# Patient Record
Sex: Female | Born: 1960 | Race: Black or African American | Hispanic: No | Marital: Married | Smoking: Never smoker
Health system: Southern US, Community
[De-identification: ages and names within clinical notes are randomized; demographics above are authoritative.]

## PROBLEM LIST (undated history)

## (undated) DIAGNOSIS — I639 Cerebral infarction, unspecified: Secondary | ICD-10-CM

## (undated) DIAGNOSIS — D649 Anemia, unspecified: Secondary | ICD-10-CM

## (undated) DIAGNOSIS — I10 Essential (primary) hypertension: Secondary | ICD-10-CM

## (undated) HISTORY — PX: COLON SURGERY: SHX602

## (undated) HISTORY — PX: GASTRIC BYPASS: SHX52

## (undated) HISTORY — DX: Anemia, unspecified: D64.9

## (undated) HISTORY — PX: TUBAL LIGATION: SHX77

## (undated) HISTORY — PX: APPENDECTOMY: SHX54

## (undated) HISTORY — DX: Cerebral infarction, unspecified: I63.9

## (undated) HISTORY — PX: ABDOMINAL HYSTERECTOMY: SHX81

## (undated) HISTORY — PX: CHOLECYSTECTOMY: SHX55

## (undated) HISTORY — PX: COSMETIC SURGERY: SHX468

---

## 2006-02-26 ENCOUNTER — Ambulatory Visit (HOSPITAL_COMMUNITY): Admission: RE | Admit: 2006-02-26 | Discharge: 2006-02-26 | Payer: Self-pay | Admitting: Obstetrics & Gynecology

## 2006-03-09 ENCOUNTER — Inpatient Hospital Stay (HOSPITAL_COMMUNITY): Admission: AD | Admit: 2006-03-09 | Discharge: 2006-03-11 | Payer: Self-pay | Admitting: Obstetrics

## 2006-04-02 ENCOUNTER — Ambulatory Visit (HOSPITAL_COMMUNITY): Admission: RE | Admit: 2006-04-02 | Discharge: 2006-04-02 | Payer: Self-pay | Admitting: Gastroenterology

## 2006-04-12 ENCOUNTER — Inpatient Hospital Stay (HOSPITAL_COMMUNITY): Admission: RE | Admit: 2006-04-12 | Discharge: 2006-04-15 | Payer: Self-pay | Admitting: Obstetrics & Gynecology

## 2011-11-02 DIAGNOSIS — E785 Hyperlipidemia, unspecified: Secondary | ICD-10-CM | POA: Insufficient documentation

## 2012-03-14 DIAGNOSIS — Z9884 Bariatric surgery status: Secondary | ICD-10-CM | POA: Insufficient documentation

## 2013-02-20 ENCOUNTER — Emergency Department (HOSPITAL_COMMUNITY)
Admission: EM | Admit: 2013-02-20 | Discharge: 2013-02-20 | Disposition: A | Discharge status: Home / Self Care | Attending: Emergency Medicine | Admitting: Emergency Medicine

## 2013-02-20 ENCOUNTER — Emergency Department (HOSPITAL_COMMUNITY)

## 2013-02-20 DIAGNOSIS — IMO0002 Reserved for concepts with insufficient information to code with codable children: Secondary | ICD-10-CM | POA: Insufficient documentation

## 2013-02-20 DIAGNOSIS — Z79899 Other long term (current) drug therapy: Secondary | ICD-10-CM | POA: Insufficient documentation

## 2013-02-20 DIAGNOSIS — R109 Unspecified abdominal pain: Secondary | ICD-10-CM

## 2013-02-20 DIAGNOSIS — R1084 Generalized abdominal pain: Secondary | ICD-10-CM | POA: Insufficient documentation

## 2013-02-20 DIAGNOSIS — Z792 Long term (current) use of antibiotics: Secondary | ICD-10-CM | POA: Insufficient documentation

## 2013-02-20 LAB — CBC WITH DIFFERENTIAL/PLATELET
Basophils Absolute: 0 10*3/uL (ref 0.0–0.1)
Basophils Relative: 1 % (ref 0–1)
Eosinophils Relative: 1 % (ref 0–5)
Lymphocytes Relative: 18 % (ref 12–46)
MCHC: 32.5 g/dL (ref 30.0–36.0)
MCV: 84.7 fL (ref 78.0–100.0)
Platelets: 264 10*3/uL (ref 150–400)
RDW: 13.4 % (ref 11.5–15.5)
WBC: 6.4 10*3/uL (ref 4.0–10.5)

## 2013-02-20 LAB — COMPREHENSIVE METABOLIC PANEL
ALT: 74 U/L — ABNORMAL HIGH (ref 0–35)
AST: 148 U/L — ABNORMAL HIGH (ref 0–37)
Albumin: 4.2 g/dL (ref 3.5–5.2)
CO2: 28 mEq/L (ref 19–32)
Calcium: 9.9 mg/dL (ref 8.4–10.5)
Creatinine, Ser: 1.02 mg/dL (ref 0.50–1.10)
GFR calc non Af Amer: 63 mL/min — ABNORMAL LOW (ref >90)
Sodium: 140 mEq/L (ref 135–145)
Total Protein: 8.4 g/dL — ABNORMAL HIGH (ref 6.0–8.3)

## 2013-02-20 MED ORDER — ONDANSETRON HCL 4 MG/2ML IJ SOLN
4.0000 mg | Freq: Once | INTRAMUSCULAR | Status: AC
  Administered 2013-02-20: 4 mg via INTRAVENOUS
  Filled 2013-02-20: qty 2

## 2013-02-20 NOTE — ED Notes (Signed)
Bed: WA12 Expected date:  Expected time:  Means of arrival:  Comments: EMS 

## 2013-02-20 NOTE — ED Provider Notes (Signed)
CSN: [id number]     Arrival date & time 02/20/13  1626 History   First MD Initiated Contact with Patient 02/20/13 1639     Chief Complaint  Patient presents with  . Abdominal Pain   (Consider location/radiation/quality/duration/timing/severity/associated sxs/prior Treatment) Patient is a 52 y.o. female presenting with abdominal pain. The history is provided by the patient (pt complains of abd pain).  Abdominal Pain Pain location:  Generalized Pain quality: aching   Pain radiates to:  Does not radiate Pain severity:  Mild Onset quality:  Sudden Timing:  Intermittent Progression:  Partially resolved Associated symptoms: no chest pain, no cough, no diarrhea, no fatigue and no hematuria     No past medical history on file. No past surgical history on file. No family history on file. History  Substance Use Topics  . Smoking status: Not on file  . Smokeless tobacco: Not on file  . Alcohol Use: Not on file   OB History   No data available     Review of Systems  Constitutional: Negative for appetite change and fatigue.  HENT: Negative for congestion, ear discharge and sinus pressure.   Eyes: Negative for discharge.  Respiratory: Negative for cough.   Cardiovascular: Negative for chest pain.  Gastrointestinal: Positive for abdominal pain. Negative for diarrhea.  Genitourinary: Negative for frequency and hematuria.  Musculoskeletal: Negative for back pain.  Skin: Negative for rash.  Neurological: Negative for seizures and headaches.  Psychiatric/Behavioral: Negative for hallucinations.    Allergies  Review of patient's allergies indicates no known allergies.  Home Medications   Current Outpatient Rx  Name  Route  Sig  Dispense  Refill  . acetaminophen-codeine 120-12 MG/5ML solution   Oral   Take 5 mLs by mouth every 6 (six) hours as needed for pain.         Marland Kitchen amoxicillin (AMOXIL) 500 MG capsule   Oral   Take 500 mg by mouth 2 (two) times daily.         .  ciprofloxacin (CIPRO) 500 MG tablet   Oral   Take 500 mg by mouth 2 (two) times daily.         . hydrochlorothiazide (MICROZIDE) 12.5 MG capsule   Oral   Take 12.5 mg by mouth daily.         . lansoprazole (PREVACID) 15 MG capsule   Oral   Take 15 mg by mouth daily.         . methylPREDNISolone (MEDROL DOSEPAK) 4 MG tablet   Oral   Take 4 mg by mouth. follow package directions         . naproxen sodium (ANAPROX) 550 MG tablet   Oral   Take 550 mg by mouth 2 (two) times daily as needed (pain).          There were no vitals taken for this visit. Physical Exam  Constitutional: She is oriented to person, place, and time. She appears well-developed.  HENT:  Head: Normocephalic.  Eyes: Conjunctivae and EOM are normal. No scleral icterus.  Neck: Neck supple. No thyromegaly present.  Cardiovascular: Normal rate and regular rhythm.  Exam reveals no gallop and no friction rub.   No murmur heard. Pulmonary/Chest: No stridor. She has no wheezes. She has no rales. She exhibits no tenderness.  Abdominal: She exhibits no distension. There is tenderness. There is no rebound.  Mild tenderness throughout  Musculoskeletal: Normal range of motion. She exhibits no edema.  Lymphadenopathy:    She has no cervical  adenopathy.  Neurological: She is oriented to person, place, and time. She exhibits normal muscle tone. Coordination normal.  Skin: No rash noted. No erythema.  Psychiatric: She has a normal mood and affect. Her behavior is normal.    ED Course  Procedures (including critical care time) Labs Review Labs Reviewed  COMPREHENSIVE METABOLIC PANEL - Abnormal; Notable for the following:    Glucose, Bld 103 (*)    Total Protein 8.4 (*)    AST 148 (*)    ALT 74 (*)    GFR calc non Af Amer 63 (*)    GFR calc Af Amer 73 (*)    All other components within normal limits  CBC WITH DIFFERENTIAL  LIPASE, BLOOD   Imaging Review Dg Abd Acute W/chest  02/20/2013   CLINICAL DATA:   Abdominal pain, nausea and vomiting  EXAM: ACUTE ABDOMEN SERIES (ABDOMEN 2 VIEW & CHEST 1 VIEW)  COMPARISON:  None.  FINDINGS: No dilated bowel loops or air-fluid levels are seen to suggest obstruction or generalized adynamic ileus. There is no free air.  Increased stool is noted in the colon. Bowel anastomosis staples are noted in the left upper quadrant and left mid abdomen. There has been a prior cholecystectomy. The soft tissues are otherwise unremarkable.  The heart, mediastinum and hila are within normal limits and the lungs are clear.  No significant bony abnormality.  IMPRESSION: No acute findings. No obstruction or free air. Clear lungs.  Increased stool noted throughout the colon.   Electronically Signed   By: Amie Portland M.D.   On: 02/20/2013 17:22    EKG Interpretation   None       MDM   1. Abdominal  pain, other specified site        Benny Lennert, MD 02/20/13 1924

## 2013-02-20 NOTE — Progress Notes (Signed)
Patient confirms her pcp is Dr. Velna Hatchet.  System updated.

## 2013-02-20 NOTE — ED Notes (Signed)
Per EMS: Abd pain started 1 hr ago. Pt had dental procedure this morning and started taking Amocillin and Cipro and a couple others. Started having burning sensation in lower abdomen. Pt found 1 month ago that she might have peptic ulcer, scheduled for endoscopy. 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 Nausea, but no vomiting or diarrhea. No abd pulsations or masses noted.

## 2014-06-13 ENCOUNTER — Encounter (HOSPITAL_COMMUNITY): Payer: Self-pay | Admitting: Emergency Medicine

## 2014-06-13 ENCOUNTER — Emergency Department (HOSPITAL_COMMUNITY)
Admission: EM | Admit: 2014-06-13 | Discharge: 2014-06-13 | Disposition: A | Discharge status: Home / Self Care | Attending: Emergency Medicine | Admitting: Emergency Medicine

## 2014-06-13 DIAGNOSIS — R110 Nausea: Secondary | ICD-10-CM | POA: Insufficient documentation

## 2014-06-13 DIAGNOSIS — T402X5A Adverse effect of other opioids, initial encounter: Secondary | ICD-10-CM | POA: Diagnosis not present

## 2014-06-13 DIAGNOSIS — T782XXA Anaphylactic shock, unspecified, initial encounter: Secondary | ICD-10-CM

## 2014-06-13 DIAGNOSIS — R0602 Shortness of breath: Secondary | ICD-10-CM | POA: Diagnosis present

## 2014-06-13 DIAGNOSIS — Z792 Long term (current) use of antibiotics: Secondary | ICD-10-CM | POA: Insufficient documentation

## 2014-06-13 DIAGNOSIS — T886XXA Anaphylactic reaction due to adverse effect of correct drug or medicament properly administered, initial encounter: Secondary | ICD-10-CM | POA: Insufficient documentation

## 2014-06-13 DIAGNOSIS — R109 Unspecified abdominal pain: Secondary | ICD-10-CM | POA: Insufficient documentation

## 2014-06-13 DIAGNOSIS — Z79899 Other long term (current) drug therapy: Secondary | ICD-10-CM | POA: Diagnosis not present

## 2014-06-13 DIAGNOSIS — I10 Essential (primary) hypertension: Secondary | ICD-10-CM | POA: Insufficient documentation

## 2014-06-13 HISTORY — DX: Essential (primary) hypertension: I10

## 2014-06-13 MED ORDER — ONDANSETRON HCL 4 MG/2ML IJ SOLN
4.0000 mg | Freq: Once | INTRAMUSCULAR | Status: AC
  Administered 2014-06-13: 4 mg via INTRAVENOUS

## 2014-06-13 MED ORDER — EPINEPHRINE 0.3 MG/0.3ML IJ SOAJ: 0.3000 mg | Freq: Once | INTRAMUSCULAR | Status: DC

## 2014-06-13 MED ORDER — PREDNISONE 20 MG PO TABS: 40.0000 mg | ORAL_TABLET | Freq: Every day | ORAL | Status: DC

## 2014-06-13 NOTE — ED Notes (Signed)
Pt allergic to codeine, pt took cheratussin tonight at 0200 this morning. EMS reports pt c/o SOB, abdominal pain, 1 episode of vomiting prior to their arrival. EMS reports bilateral wheezing, adm 2 breathing treatments. Pt received 0.3 of epi, 50 mg benadryl, 50 mg zantac, 10 mg of albuterol, 125 mg of solu-medrol. A&O X4.

## 2014-06-13 NOTE — ED Notes (Signed)
MD at bedside. 

## 2014-06-13 NOTE — ED Provider Notes (Signed)
CSN: [id number]     Arrival date & time 06/13/14  [id number] History  This chart was scribed for Audree CamelScott T Brendia Dampier, MD by Roxy Cedarhandni Bhalodia, ED Scribe. This patient was seen in room D31C/D31C and the patient's care was started at 3:56 AM.   Chief Complaint  Patient presents with  . Allergic Reaction  . Shortness of Breath  . Abdominal Pain   Patient is a 54 y.o. female presenting with allergic reaction, shortness of breath, and abdominal pain. The history is provided by the patient. No language interpreter was used.  Allergic Reaction Presenting symptoms: difficulty breathing and wheezing   Presenting symptoms: no rash   Severity:  Moderate Prior allergic episodes:  Allergies to medications Context: medications (codeine)   Relieved by:  Nothing Worsened by:  Nothing tried Ineffective treatments: breathing treatment, epipen, benadryl, zantac, albuterol, solumedrol. Shortness of Breath Associated symptoms: abdominal pain and wheezing   Associated symptoms: no rash and no vomiting   Abdominal Pain Associated symptoms: nausea and shortness of breath   Associated symptoms: no vomiting     HPI Comments: Cindy Cain is a 54 y.o. female with a history of codeine allergies, who presents to the Emergency Department complaining of allergic reaction with associated shortness of breath, intermittent abdominal pain and 1 episode of emesis that began earlier today when patient took medication that contained codeine (cheratussin). Patient also reports associated nausea, and wheezing. She denies associated rash or itchiness. She reports similar allergic reaction to codeine in the past after having a gastric bypass surgery. Was hypoxic on EMS arrival. Patient was given 2 breathing treatments, 0.3 mg epi, 50 g Benadryl, 50 mg Zantac, and 125 mg of soluMedrol. Shortly after this she felt significantly improved. She is mildly nauseated and has abdominal cramping at this time but otherwise feels almost back to  normal.  Past Medical History  Diagnosis Date  . Hypertension    Past Surgical History  Procedure Laterality Date  . Abdominal hysterectomy    . Cholecystectomy    . Gastric bypass    . Appendectomy     No family history on file. History  Substance Use Topics  . Smoking status: Never Smoker   . Smokeless tobacco: Not on file  . Alcohol Use: No   OB History    No data available     Review of Systems  Respiratory: Positive for shortness of breath and wheezing.   Gastrointestinal: Positive for nausea and abdominal pain. Negative for vomiting.  Skin: Negative for rash.  Allergic/Immunologic:       Codeine  All other systems reviewed and are negative.  Allergies  Aspirin and Codeine  Home Medications   Prior to Admission medications   Medication Sig Start Date End Date Taking? Authorizing Provider  acetaminophen-codeine 120-12 MG/5ML solution Take 5 mLs by mouth every 6 (six) hours as needed for pain.    Historical Provider, MD  amoxicillin (AMOXIL) 500 MG capsule Take 500 mg by mouth 2 (two) times daily. 02/05/13   Historical Provider, MD  ciprofloxacin (CIPRO) 500 MG tablet Take 500 mg by mouth 2 (two) times daily. 02/18/13   Historical Provider, MD  hydrochlorothiazide (MICROZIDE) 12.5 MG capsule Take 12.5 mg by mouth daily.    Historical Provider, MD  lansoprazole (PREVACID) 15 MG capsule Take 15 mg by mouth daily.    Historical Provider, MD  methylPREDNISolone (MEDROL DOSEPAK) 4 MG tablet Take 4 mg by mouth. follow package directions 02/20/13   Historical Provider, MD  naproxen  sodium (ANAPROX) 550 MG tablet Take 550 mg by mouth 2 (two) times daily as needed (pain).    Historical Provider, MD   Triage Vitals: BP 143/93 mmHg  Pulse 91  Temp(Src) 98.2 F (36.8 C)  Ht  (1.651 m)  Wt 187 lb (84.823 kg)  BMI 31.12 kg/m2  SpO2 100%  Physical Exam  Constitutional: She is oriented to person, place, and time. She appears well-developed and well-nourished. No  distress.  HENT:  Head: Normocephalic and atraumatic.  Right Ear: External ear normal.  Left Ear: External ear normal.  Nose: Nose normal.  Mouth/Throat: Oropharynx is clear and moist.  No oropharyngeal swelling  Eyes: Right eye exhibits no discharge. Left eye exhibits no discharge.  Neck: Neck supple. No tracheal deviation present.  Cardiovascular: Normal rate, regular rhythm and normal heart sounds.   Pulmonary/Chest: Effort normal and breath sounds normal. No respiratory distress. She has no wheezes.  Neurological: She is alert and oriented to person, place, and time.  Skin: Skin is warm and dry. No rash noted. She is not diaphoretic.  Psychiatric: She has a normal mood and affect. Her behavior is normal.  Nursing note and vitals reviewed.  ED Course  Procedures (including critical care time)  DIAGNOSTIC STUDIES: Oxygen Saturation is 100% on RA, normal by my interpretation.    COORDINATION OF CARE: 3:59 AM- Gave patient Zofran upon arrival. Per EMS, patient was given 2 breathing treatments. Pt received 0.3 of epi, 50 mg benadryl, 50 mg zantac, 10 mg of albuterol, 125 mg of solu-medrol. Discussed plans to monitor patient. Pt advised of plan for treatment and pt agrees.  Labs Review Labs Reviewed - No data to display  Imaging Review No results found.   EKG Interpretation None     MDM   Final diagnoses:  Anaphylaxis, initial encounter    Patient symptoms are consistent with anaphylaxis to codeine. Patient has had allergic reactions to codeine in the past. Her symptoms have resolved after EMS treatment as above. Patient is now well appearing, in no acute distress. Watched in the ER for 3 hours (4 hours s/p epi) and had no recurrence of symptoms. Stable for d/c   I personally performed the services described in this documentation, which was scribed in my presence. The recorded information has been reviewed and is accurate.    Audree Camel, MD 06/13/14 [phone]

## 2014-06-13 NOTE — Discharge Instructions (Signed)

## 2014-07-30 ENCOUNTER — Emergency Department (HOSPITAL_COMMUNITY)

## 2014-07-30 ENCOUNTER — Encounter (HOSPITAL_COMMUNITY): Payer: Self-pay | Admitting: Emergency Medicine

## 2014-07-30 ENCOUNTER — Emergency Department (HOSPITAL_COMMUNITY)
Admission: EM | Admit: 2014-07-30 | Discharge: 2014-07-30 | Disposition: A | Discharge status: Home / Self Care | Attending: Emergency Medicine | Admitting: Emergency Medicine

## 2014-07-30 DIAGNOSIS — R509 Fever, unspecified: Secondary | ICD-10-CM | POA: Diagnosis not present

## 2014-07-30 DIAGNOSIS — I10 Essential (primary) hypertension: Secondary | ICD-10-CM | POA: Insufficient documentation

## 2014-07-30 DIAGNOSIS — Z3202 Encounter for pregnancy test, result negative: Secondary | ICD-10-CM | POA: Diagnosis not present

## 2014-07-30 DIAGNOSIS — R42 Dizziness and giddiness: Secondary | ICD-10-CM | POA: Diagnosis not present

## 2014-07-30 DIAGNOSIS — R197 Diarrhea, unspecified: Secondary | ICD-10-CM | POA: Diagnosis not present

## 2014-07-30 DIAGNOSIS — R5383 Other fatigue: Secondary | ICD-10-CM | POA: Insufficient documentation

## 2014-07-30 DIAGNOSIS — R112 Nausea with vomiting, unspecified: Secondary | ICD-10-CM | POA: Diagnosis not present

## 2014-07-30 DIAGNOSIS — Z7952 Long term (current) use of systemic steroids: Secondary | ICD-10-CM | POA: Insufficient documentation

## 2014-07-30 DIAGNOSIS — Z79899 Other long term (current) drug therapy: Secondary | ICD-10-CM | POA: Insufficient documentation

## 2014-07-30 DIAGNOSIS — R0602 Shortness of breath: Secondary | ICD-10-CM | POA: Insufficient documentation

## 2014-07-30 LAB — COMPREHENSIVE METABOLIC PANEL
ALBUMIN: 3.6 g/dL (ref 3.5–5.2)
ALK PHOS: 70 U/L (ref 39–117)
ALT: 28 U/L (ref 0–35)
AST: 33 U/L (ref 0–37)
Anion gap: 9 (ref 5–15)
BUN: 18 mg/dL (ref 6–23)
CO2: 29 mmol/L (ref 19–32)
Calcium: 8.7 mg/dL (ref 8.4–10.5)
Chloride: 100 mmol/L (ref 96–112)
Creatinine, Ser: 0.98 mg/dL (ref 0.50–1.10)
GFR calc Af Amer: 76 mL/min — ABNORMAL LOW (ref >90)
GFR calc non Af Amer: 65 mL/min — ABNORMAL LOW (ref >90)
Glucose, Bld: 98 mg/dL (ref 70–99)
POTASSIUM: 3 mmol/L — AB (ref 3.5–5.1)
SODIUM: 138 mmol/L (ref 135–145)
TOTAL PROTEIN: 6.9 g/dL (ref 6.0–8.3)
Total Bilirubin: 0.3 mg/dL (ref 0.3–1.2)

## 2014-07-30 LAB — URINALYSIS, ROUTINE W REFLEX MICROSCOPIC
Bilirubin Urine: NEGATIVE
Glucose, UA: NEGATIVE mg/dL
HGB URINE DIPSTICK: NEGATIVE
Ketones, ur: NEGATIVE mg/dL
LEUKOCYTES UA: NEGATIVE
Nitrite: NEGATIVE
PROTEIN: NEGATIVE mg/dL
Specific Gravity, Urine: 1.008 (ref 1.005–1.030)
UROBILINOGEN UA: 0.2 mg/dL (ref 0.0–1.0)
pH: 6 (ref 5.0–8.0)

## 2014-07-30 LAB — CBC WITH DIFFERENTIAL/PLATELET
BASOS ABS: 0 10*3/uL (ref 0.0–0.1)
BASOS PCT: 0 % (ref 0–1)
EOS ABS: 0.2 10*3/uL (ref 0.0–0.7)
EOS PCT: 5 % (ref 0–5)
HEMATOCRIT: 38.5 % (ref 36.0–46.0)
HEMOGLOBIN: 12.4 g/dL (ref 12.0–15.0)
Lymphocytes Relative: 25 % (ref 12–46)
Lymphs Abs: 1.1 10*3/uL (ref 0.7–4.0)
MCH: 28.4 pg (ref 26.0–34.0)
MCHC: 32.2 g/dL (ref 30.0–36.0)
MCV: 88.1 fL (ref 78.0–100.0)
Monocytes Absolute: 0.3 10*3/uL (ref 0.1–1.0)
Monocytes Relative: 8 % (ref 3–12)
NEUTROS PCT: 62 % (ref 43–77)
Neutro Abs: 2.8 10*3/uL (ref 1.7–7.7)
Platelets: 226 10*3/uL (ref 150–400)
RBC: 4.37 MIL/uL (ref 3.87–5.11)
RDW: 13.5 % (ref 11.5–15.5)
WBC: 4.5 10*3/uL (ref 4.0–10.5)

## 2014-07-30 LAB — POC URINE PREG, ED: Preg Test, Ur: NEGATIVE

## 2014-07-30 MED ORDER — MECLIZINE HCL 12.5 MG PO TABS: 12.5000 mg | ORAL_TABLET | Freq: Three times a day (TID) | ORAL | Status: DC | PRN

## 2014-07-30 MED ORDER — MECLIZINE HCL 25 MG PO TABS
25.0000 mg | ORAL_TABLET | Freq: Once | ORAL | Status: AC
  Administered 2014-07-30: 25 mg via ORAL
  Filled 2014-07-30: qty 1

## 2014-07-30 MED ORDER — ONDANSETRON HCL 4 MG/2ML IJ SOLN
4.0000 mg | Freq: Once | INTRAMUSCULAR | Status: AC
  Administered 2014-07-30: 4 mg via INTRAVENOUS
  Filled 2014-07-30: qty 2

## 2014-07-30 MED ORDER — SODIUM CHLORIDE 0.9 % IV BOLUS (SEPSIS)
1000.0000 mL | Freq: Once | INTRAVENOUS | Status: AC
  Administered 2014-07-30: 1000 mL via INTRAVENOUS

## 2014-07-30 NOTE — ED Notes (Signed)
[facility] Expected date:  Expected time:  Means of arrival:  Comments: NVD 54 y/o F

## 2014-07-30 NOTE — ED Notes (Signed)
Per EMS pt n/v since last night. Pt denies diarrhea or pain. Pt given 4 mg of zofran en route with EMS.

## 2014-07-30 NOTE — ED Notes (Signed)
Patient transported to X-ray 

## 2014-07-30 NOTE — Discharge Instructions (Signed)
Dizziness °Dizziness is a common problem. It is a feeling of unsteadiness or light-headedness. You may feel like you are about to faint. Dizziness can lead to injury if you stumble or fall. A person of any age group can suffer from dizziness, but dizziness is more common in older adults. °CAUSES  °Dizziness can be caused by many different things, including: °· Middle ear problems. °· Standing for too long. °· Infections. °· An allergic reaction. °· Aging. °· An emotional response to something, such as the sight of blood. °· Side effects of medicines. °· Tiredness. °· Problems with circulation or blood pressure. °· Excessive use of alcohol or medicines, or illegal drug use. °· Breathing too fast (hyperventilation). °· An irregular heart rhythm (arrhythmia). °· A low red blood cell count (anemia). °· Pregnancy. °· Vomiting, diarrhea, fever, or other illnesses that cause body fluid loss (dehydration). °· Diseases or conditions such as Parkinson's disease, high blood pressure (hypertension), diabetes, and thyroid problems. °· Exposure to extreme heat. °DIAGNOSIS  °Your health care provider will ask about your symptoms, perform a physical exam, and perform an electrocardiogram (ECG) to record the electrical activity of your heart. Your health care provider may also perform other heart or blood tests to determine the cause of your dizziness. These may include: °· Transthoracic echocardiogram (TTE). During echocardiography, sound waves are used to evaluate how blood flows through your heart. °· Transesophageal echocardiogram (TEE). °· Cardiac monitoring. This allows your health care provider to monitor your heart rate and rhythm in real time. °· Holter monitor. This is a portable device that records your heartbeat and can help diagnose heart arrhythmias. It allows your health care provider to track your heart activity for several days if needed. °· Stress tests by exercise or by giving medicine that makes the heart beat  faster. °TREATMENT  °Treatment of dizziness depends on the cause of your symptoms and can vary greatly. °HOME CARE INSTRUCTIONS  °· Drink enough fluids to keep your urine clear or pale yellow. This is especially important in very hot weather. In older adults, it is also important in cold weather. °· Take your medicine exactly as directed if your dizziness is caused by medicines. When taking blood pressure medicines, it is especially important to get up slowly. °¨ Rise slowly from chairs and steady yourself until you feel okay. °¨ In the morning, first sit up on the side of the bed. When you feel okay, stand slowly while holding onto something until you know your balance is fine. °· Move your legs often if you need to stand in one place for a long time. Tighten and relax your muscles in your legs while standing. °· Have someone stay with you for 1-2 days if dizziness continues to be a problem. Do this until you feel you are well enough to stay alone. Have the person call your health care provider if he or she notices changes in you that are concerning. °· Do not drive or use heavy machinery if you feel dizzy. °· Do not drink alcohol. °SEEK IMMEDIATE MEDICAL CARE IF:  °· Your dizziness or light-headedness gets worse. °· You feel nauseous or vomit. °· You have problems talking, walking, or using your arms, hands, or legs. °· You feel weak. °· You are not thinking clearly or you have trouble forming sentences. It may take a friend or family member to notice this. °· You have chest pain, abdominal pain, shortness of breath, or sweating. °· Your vision changes. °· You notice   any bleeding. °· You have side effects from medicine that seems to be getting worse rather than better. °MAKE SURE YOU:  °· Understand these instructions. °· Will watch your condition. °· Will get help right away if you are not doing well or get worse. °Document Released: 10/18/2000 Document Revised: 04/29/2013 Document Reviewed: 11/11/2010 °[employer]®  Patient Information ©2015 [employer]. This information is not intended to replace advice given to you by your health care provider. Make sure you discuss any questions you have with your health care provider. °Nausea and Vomiting °Nausea is a sick feeling that often comes before throwing up (vomiting). Vomiting is a reflex where stomach contents come out of your mouth. Vomiting can cause severe loss of body fluids (dehydration). Children and elderly adults can become dehydrated quickly, especially if they also have diarrhea. Nausea and vomiting are symptoms of a condition or disease. It is important to find the cause of your symptoms. °CAUSES  °· Direct irritation of the stomach lining. This irritation can result from increased acid production (gastroesophageal reflux disease), infection, food poisoning, taking certain medicines (such as nonsteroidal anti-inflammatory drugs), alcohol use, or tobacco use. °· Signals from the brain. These signals could be caused by a headache, heat exposure, an inner ear disturbance, increased pressure in the brain from injury, infection, a tumor, or a concussion, pain, emotional stimulus, or metabolic problems. °· An obstruction in the gastrointestinal tract (bowel obstruction). °· Illnesses such as diabetes, hepatitis, gallbladder problems, appendicitis, kidney problems, cancer, sepsis, atypical symptoms of a heart attack, or eating disorders. °· Medical treatments such as chemotherapy and radiation. °· Receiving medicine that makes you sleep (general anesthetic) during surgery. °DIAGNOSIS °Your caregiver may ask for tests to be done if the problems do not improve after a few days. Tests may also be done if symptoms are severe or if the reason for the nausea and vomiting is not clear. Tests may include: °· Urine tests. °· Blood tests. °· Stool tests. °· Cultures (to look for evidence of infection). °· X-rays or other imaging studies. °Test results can help your caregiver make  decisions about treatment or the need for additional tests. °TREATMENT °You need to stay well hydrated. Drink frequently but in small amounts. You may wish to drink water, sports drinks, clear broth, or eat frozen ice pops or gelatin dessert to help stay hydrated. When you eat, eating slowly may help prevent nausea. There are also some antinausea medicines that may help prevent nausea. °HOME CARE INSTRUCTIONS  °· Take all medicine as directed by your caregiver. °· If you do not have an appetite, do not force yourself to eat. However, you must continue to drink fluids. °· If you have an appetite, eat a normal diet unless your caregiver tells you differently. °¨ Eat a variety of complex carbohydrates (rice, wheat, potatoes, bread), lean meats, yogurt, fruits, and vegetables. °¨ Avoid high-fat foods because they are more difficult to digest. °· Drink enough water and fluids to keep your urine clear or pale yellow. °· If you are dehydrated, ask your caregiver for specific rehydration instructions. Signs of dehydration may include: °¨ Severe thirst. °¨ Dry lips and mouth. °¨ Dizziness. °¨ Dark urine. °¨ Decreasing urine frequency and amount. °¨ Confusion. °¨ Rapid breathing or pulse. °SEEK IMMEDIATE MEDICAL CARE IF:  °· You have blood or brown flecks (like coffee grounds) in your vomit. °· You have black or bloody stools. °· You have a severe headache or stiff neck. °· You are confused. °· You have severe abdominal pain. °·   You have chest pain or trouble breathing. °· You do not urinate at least once every 8 hours. °· You develop cold or clammy skin. °· You continue to vomit for longer than 24 to 48 hours. °· You have a fever. °MAKE SURE YOU:  °· Understand these instructions. °· Will watch your condition. °· Will get help right away if you are not doing well or get worse. °Document Released: 04/24/2005 Document Revised: 07/17/2011 Document Reviewed: 09/21/2010 °[employer]® Patient Information ©2015 [employer]. This  information is not intended to replace advice given to you by your health care provider. Make sure you discuss any questions you have with your health care provider. ° °

## 2014-07-30 NOTE — ED Provider Notes (Signed)
CSN: [id number]     Arrival date & time 07/30/14  1334 History   First MD Initiated Contact with Patient 07/30/14 1414     Chief Complaint  Patient presents with  . Emesis   Cindy Cain is a 54 y.o. female with a history of hypertension and vertigo who presents to the emergency department complaining of feeling lightheaded associated with nausea and vomiting since last night. Patient reports a history of vertigo and reports this started last night and associated with lightheadedness. She reports feeling lightheaded and weak yesterday.  She reports this feels slightly different from her vertigo and that it is more lightheadedness. After the lightheadedness he started having nausea and vomiting and reports a fever with a maximum temperature 100.7 last night. Patient reports vomiting once today. Her last bowel movement was yesterday and was diarrhea. She also reports urinary frequency yesterday but denies dysuria. She denies current headache but reports she had one yesterday. Patient reports feeling short of breath earlier but denies current shortness of breath. The patient denies sick contacts, abdominal pain, hematemesis, headache, blurry vision, changes to her vision, dysuria, hematuria, cough, chest pain, palpitations, syncope, numbness, tingling, weakness.  She denies any chest pain or palpitations during her lightheadedness.  (Consider location/radiation/quality/duration/timing/severity/associated sxs/prior Treatment) HPI  Past Medical History  Diagnosis Date  . Hypertension    Past Surgical History  Procedure Laterality Date  . Abdominal hysterectomy    . Cholecystectomy    . Gastric bypass    . Appendectomy     No family history on file. History  Substance Use Topics  . Smoking status: Never Smoker   . Smokeless tobacco: Not on file  . Alcohol Use: No   OB History    No data available     Review of Systems  Constitutional: Positive for fever and fatigue. Negative for  chills.  HENT: Negative for congestion, ear pain, sore throat and trouble swallowing.   Eyes: Negative for pain and visual disturbance.  Respiratory: Positive for shortness of breath. Negative for cough and wheezing.   Cardiovascular: Negative for chest pain, palpitations and leg swelling.  Gastrointestinal: Positive for nausea, vomiting and diarrhea. Negative for abdominal pain and blood in stool.  Genitourinary: Positive for urgency. Negative for dysuria, frequency, hematuria, flank pain, vaginal bleeding, vaginal discharge, difficulty urinating and pelvic pain.  Musculoskeletal: Negative for back pain, neck pain and neck stiffness.  Skin: Negative for rash.  Neurological: Positive for dizziness and light-headedness. Negative for weakness, numbness and headaches.      Allergies  Aspirin and Codeine  Home Medications   Prior to Admission medications   Medication Sig Start Date End Date Taking? Authorizing Provider  CALCIUM PO Take 1 tablet by mouth daily.   Yes Historical Provider, MD  EPINEPHrine 0.3 mg/0.3 mL IJ SOAJ injection Inject 0.3 mLs (0.3 mg total) into the muscle once. 06/13/14  Yes Pricilla LovelessScott Goldston, MD  lansoprazole (PREVACID) 15 MG capsule Take 15 mg by mouth daily as needed (when taking aleve).    Yes Historical Provider, MD  lisinopril-hydrochlorothiazide (PRINZIDE,ZESTORETIC) 10-12.5 MG per tablet Take 1 tablet by mouth daily.   Yes Historical Provider, MD  Multiple Vitamins-Minerals (MULTIVITAMIN WITH MINERALS) tablet Take 1 tablet by mouth daily.   Yes Historical Provider, MD  naproxen sodium (ANAPROX) 220 MG tablet Take 220 mg by mouth daily as needed (pain).   Yes Historical Provider, MD  meclizine (ANTIVERT) 12.5 MG tablet Take 1 tablet (12.5 mg total) by mouth 3 (three) times  daily as needed for dizziness. 07/30/14   Everlene Farrier, PA-C  predniSONE (DELTASONE) 20 MG tablet Take 2 tablets (40 mg total) by mouth daily. 06/13/14   Pricilla Loveless, MD   BP 111/66 mmHg   Pulse 49  Temp(Src) 98.3 F (36.8 C) (Oral)  Resp 14  SpO2 100% Physical Exam  Constitutional: She is oriented to person, place, and time. She appears well-developed and well-nourished. No distress.  HENT:  Head: Normocephalic and atraumatic.  Right Ear: External ear normal.  Left Ear: External ear normal.  Nose: Nose normal.  Mouth/Throat: Oropharynx is clear and moist. No oropharyngeal exudate.  Bilateral tympanic membranes are pearly-gray without erythema or loss of landmarks. No temporal edema or tenderness.   Eyes: Conjunctivae and EOM are normal. Pupils are equal, round, and reactive to light. Right eye exhibits no discharge. Left eye exhibits no discharge.  Neck: Normal range of motion. Neck supple. No JVD present. No tracheal deviation present.  Cardiovascular: Normal rate, regular rhythm, normal heart sounds and intact distal pulses.  Exam reveals no gallop and no friction rub.   No murmur heard. Pulmonary/Chest: Effort normal and breath sounds normal. No respiratory distress. She has no wheezes. She has no rales.  Abdominal: Soft. Bowel sounds are normal. She exhibits no distension and no mass. There is no tenderness. There is no rebound and no guarding.  Abdomen is soft and nontender to palpation. Bowel sounds are present. No McBurney's point tenderness. Negative psoas and obturator sign.  Musculoskeletal: She exhibits no edema.  Lymphadenopathy:    She has no cervical adenopathy.  Neurological: She is alert and oriented to person, place, and time. No cranial nerve deficit. Coordination normal.   Cranial nerves are intact. EOMs intact bilaterally. Finger to nose intact bilaterally. Sensation intact in her bilateral face, upper and lower extremities.  Skin: Skin is warm and dry. No rash noted. She is not diaphoretic. No erythema. No pallor.  Psychiatric: She has a normal mood and affect. Her behavior is normal.  Nursing note and vitals reviewed.   ED Course  Procedures  (including critical care time) Labs Review Labs Reviewed  COMPREHENSIVE METABOLIC PANEL - Abnormal; Notable for the following:    Potassium 3.0 (*)    GFR calc non Af Amer 65 (*)    GFR calc Af Amer 76 (*)    All other components within normal limits  CBC WITH DIFFERENTIAL/PLATELET  URINALYSIS, ROUTINE W REFLEX MICROSCOPIC  POC URINE PREG, ED    Imaging Review Dg Chest 2 View  07/30/2014   CLINICAL DATA:  54 year old female with weakness nausea and vomiting x1 day. Near syncope. Initial encounter.  EXAM: CHEST  2 VIEW  COMPARISON:  Abdominal series 1016 2014.  FINDINGS: Semi upright AP and lateral views of the chest. Normal lung volumes. Normal cardiac size and mediastinal contours. Visualized tracheal air column is within normal limits. The lungs are clear. No pneumothorax or pleural effusion. Stable cholecystectomy clips. Negative visible bowel gas pattern. No acute osseous abnormality identified.  IMPRESSION: Negative, no acute cardiopulmonary abnormality.   Electronically Signed   By: Odessa Fleming M.D.   On: 07/30/2014 16:28     EKG Interpretation   Date/Time:  Thursday July 30 2014 16:19:24 EDT Ventricular Rate:  49 PR Interval:  147 QRS Duration: 77 QT Interval:  466 QTC Calculation: 421 R Axis:   63 Text Interpretation:  Sinus bradycardia No old tracing to compare  Confirmed by KOHUT  MD, STEPHEN [phone]) on 07/30/2014 4:30:38 PM  Filed Vitals:   07/30/14 1342 07/30/14 1349 07/30/14 1625  BP:  101/59 111/66  Pulse:  54 49  Temp:  98.3 F (36.8 C)   TempSrc:  Oral   Resp:  16 14  SpO2: 100% 92% 100%     MDM   Meds given in ED:  Medications  sodium chloride 0.9 % bolus 1,000 mL (1,000 mLs Intravenous New Bag/Given 07/30/14 1415)  meclizine (ANTIVERT) tablet 25 mg (25 mg Oral Given 07/30/14 1518)  ondansetron (ZOFRAN) injection 4 mg (4 mg Intravenous Given 07/30/14 1519)    New Prescriptions   MECLIZINE (ANTIVERT) 12.5 MG TABLET    Take 1 tablet (12.5 mg total) by  mouth 3 (three) times daily as needed for dizziness.    Final diagnoses:  Lightheadedness  Non-intractable vomiting with nausea, vomiting of unspecified type   This is a 54 y.o. female with a history of hypertension and vertigo who presents to the emergency department complaining of feeling lightheaded associated with nausea and vomiting since last night. She reports some lightheadedness associated with position change.  Patient is afebrile and nontoxic appearing. Patient's abdomen is soft and nontender. She denies any abdominal pain. Patient denies any chest pain or palpitations. Patient did report some urinary frequency however her urinalysis is negative for infection. She is a negative urine pregnancy test. CBC is within normal limits. Her CMP indicates a potassium of 3.0 but is otherwise unremarkable.  She has no focal neuro deficits. Patient is able to ambulate without difficulty or assistance. She is not orthostatic. EKG shows sinus bradycardia and is otherwise unremarkable.  After fluid bolus and meclizine the patient reports feeling much better.  She ambulated to the bathroom as well as ambulated for me in the room. She tolerated PO liquids prior to discharge. She feels like the meclizine helped a lot and would like a RX.  We'll discharge her the prescription for meclizine and recommend close follow-up by her primary care provider. Strict return precautions provided. I advised the patient to follow-up with their primary care provider this week. I advised the patient to return to the emergency department with new or worsening symptoms or new concerns. The patient verbalized understanding and agreement with plan.   This patient was discussed with and evaluated by Dr. Anitra Lauth who agrees with assessment and plan.      Everlene Farrier, PA-C 07/30/14 1717  Gwyneth Sprout, MD 07/31/14 [phone]

## 2014-10-14 IMAGING — CR DG ABDOMEN ACUTE W/ 1V CHEST
4 series · 4 of 4 positions shown · non-contrast
Comparison: None.

CLINICAL DATA: Abdominal pain, nausea and vomiting

EXAM:
ACUTE ABDOMEN SERIES (ABDOMEN 2 VIEW & CHEST 1 VIEW)

[w chest pa]
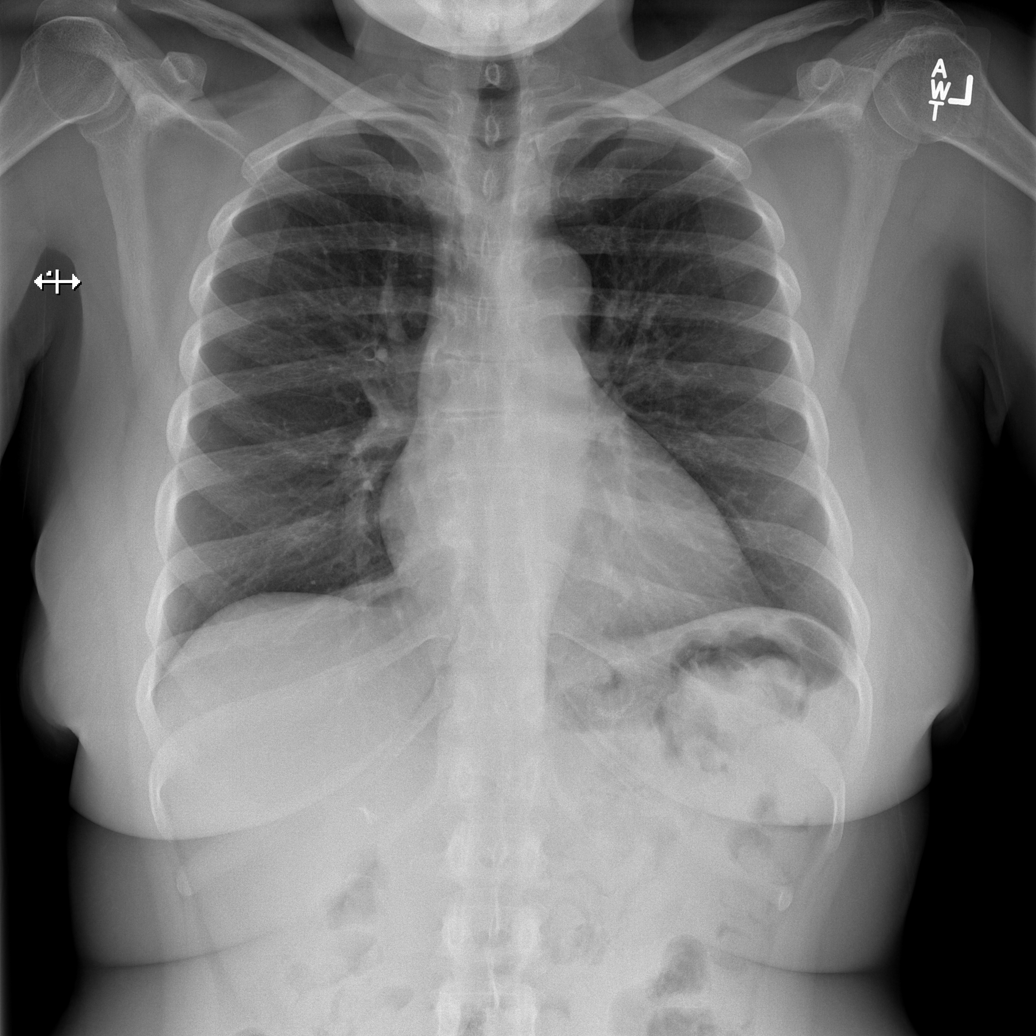

[w abdomen upright]
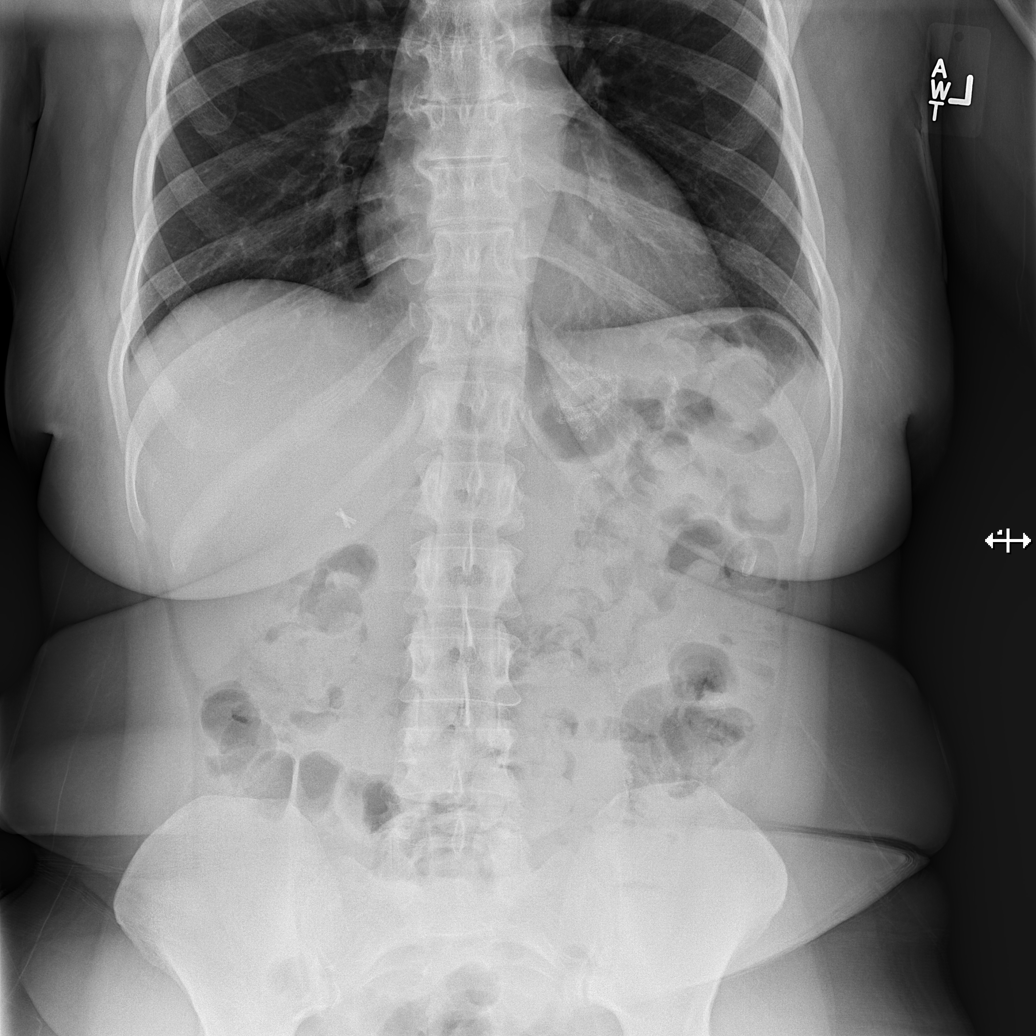

[t abdomen supine (1 of 2)]
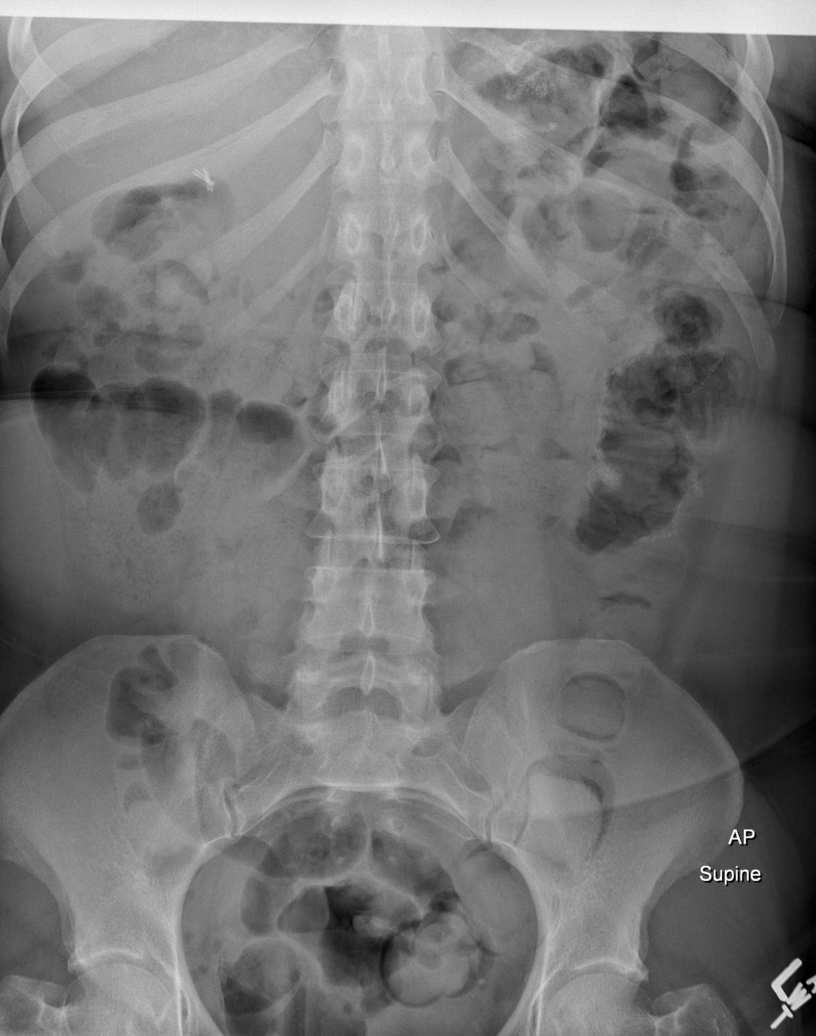

[t abdomen supine (2 of 2)]
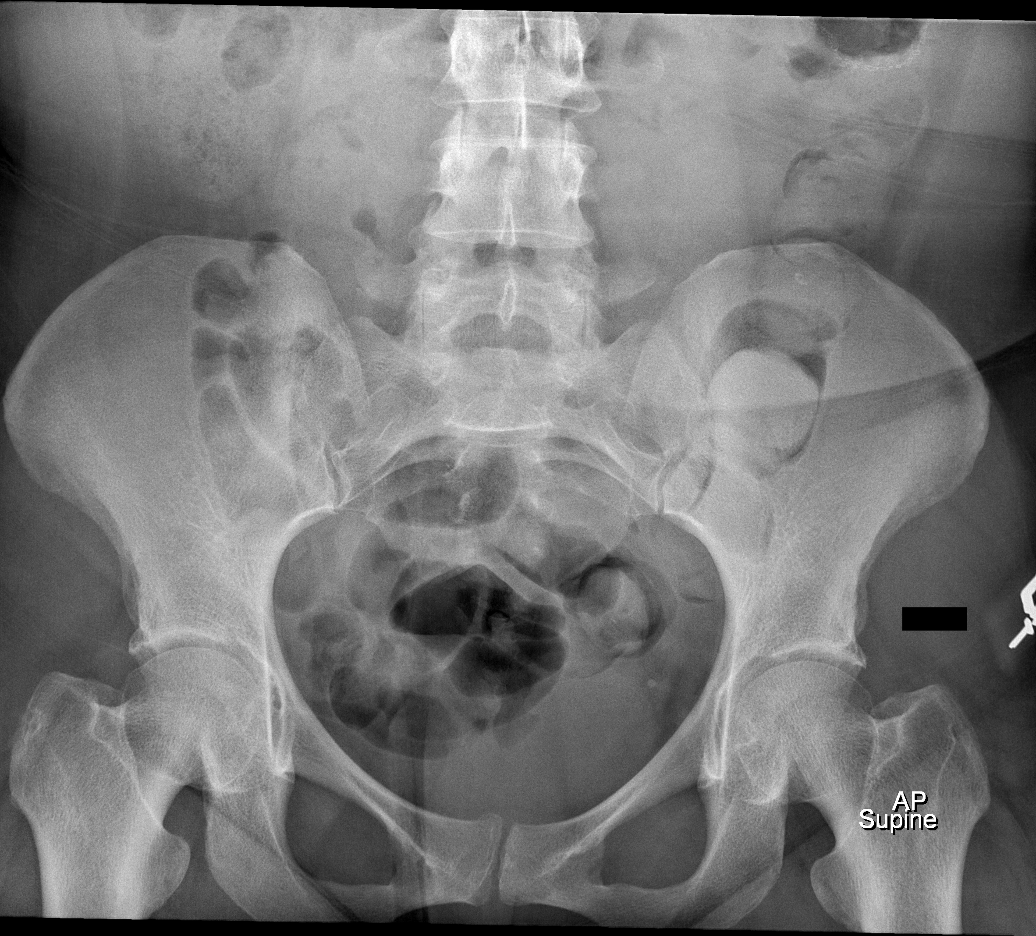

[4 of 4 positions shown; findings below may reference images not displayed]

FINDINGS: No dilated bowel loops or air-fluid levels are seen to suggest
obstruction or generalized adynamic ileus. There is no free air.

Increased stool is noted in the colon. Bowel anastomosis staples are
noted in the left upper quadrant and left mid abdomen. There has
been a prior cholecystectomy. The soft tissues are otherwise
unremarkable.

The heart, mediastinum and hila are within normal limits and the
lungs are clear.

No significant bony abnormality.
IMPRESSION: No acute findings. No obstruction or free air. Clear lungs.

Increased stool noted throughout the colon.

## 2016-01-24 DIAGNOSIS — S83222A Peripheral tear of medial meniscus, current injury, left knee, initial encounter: Secondary | ICD-10-CM | POA: Insufficient documentation

## 2016-03-22 IMAGING — CR DG CHEST 2V
2 series · 2 of 2 positions shown · non-contrast
Comparison: Abdominal series [id number].

CLINICAL DATA: 53-year-old female with weakness nausea and vomiting
x1 day. Near syncope. Initial encounter.

EXAM:
CHEST  2 VIEW

[w chest lat]
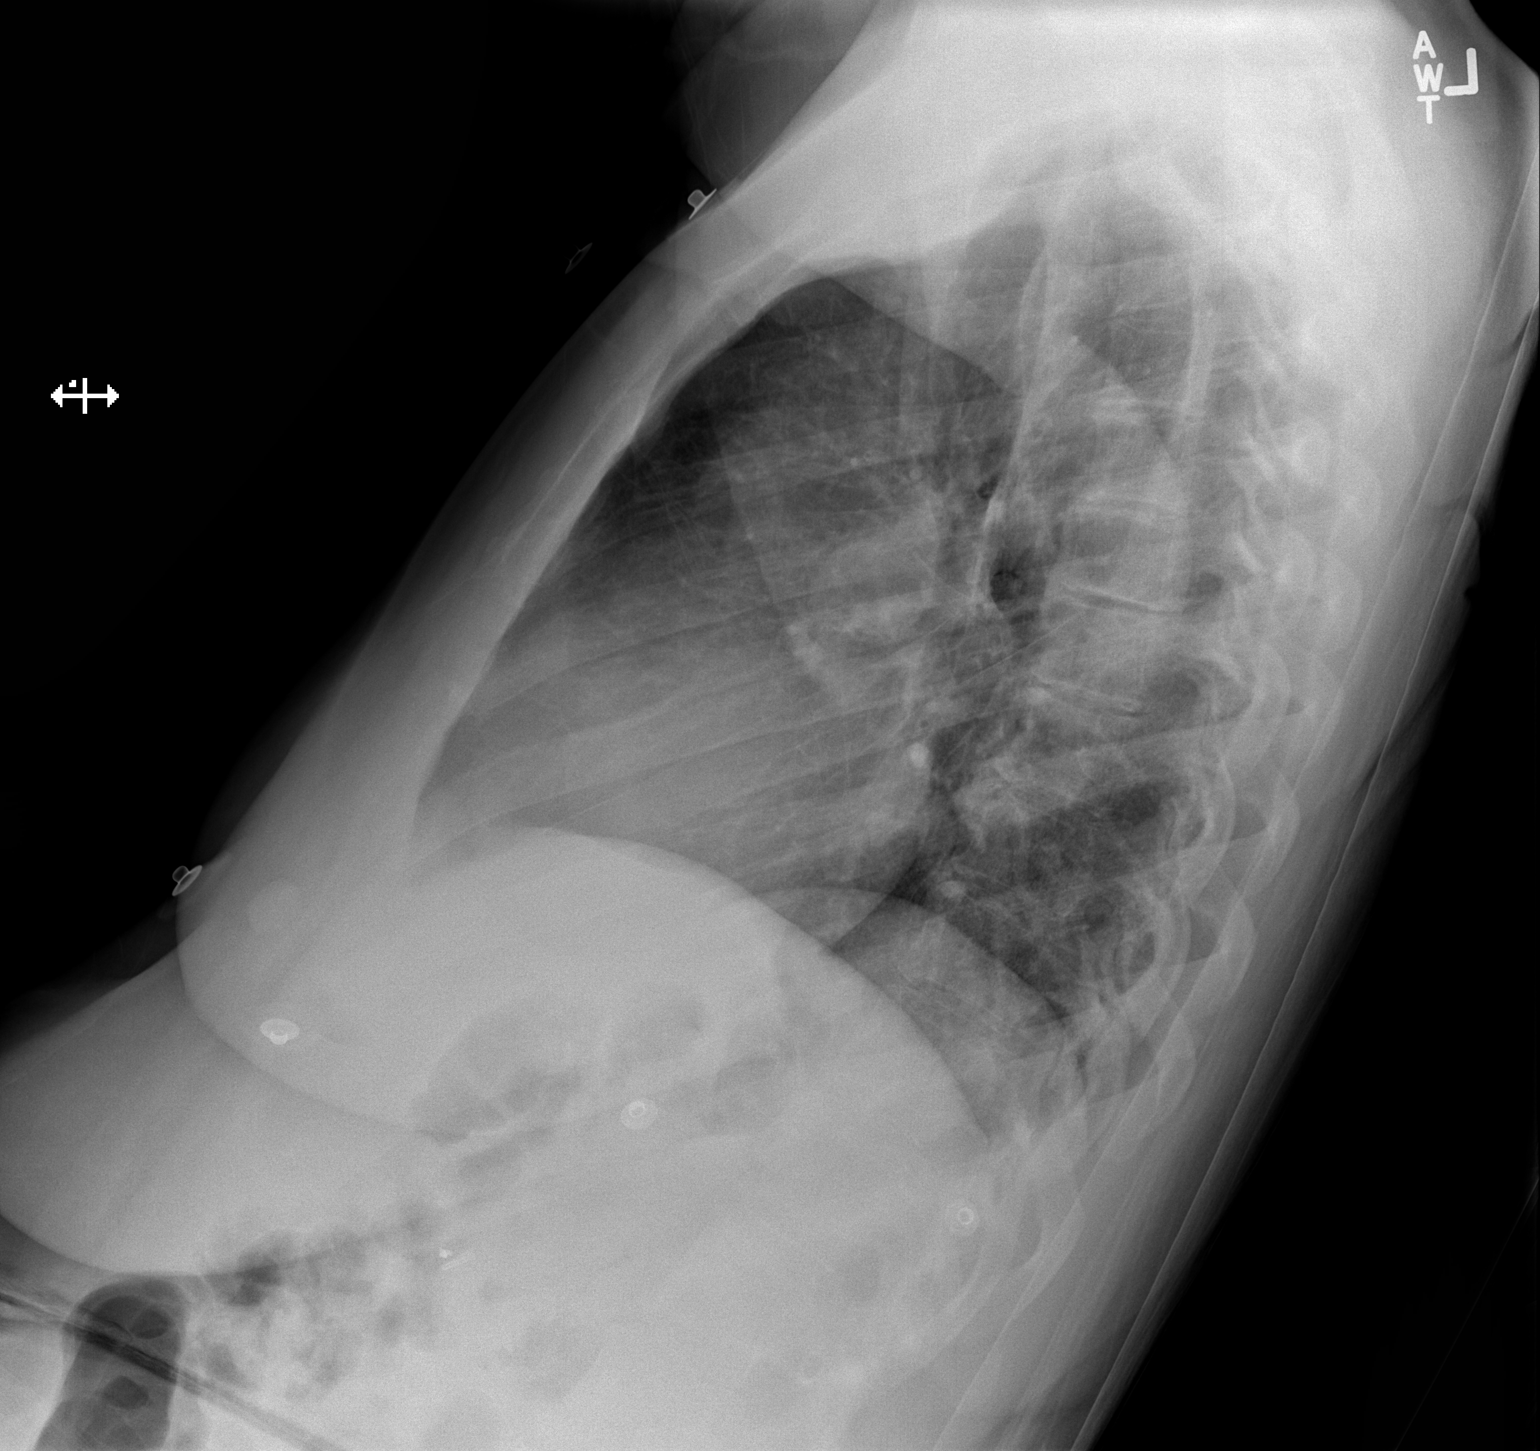

[x chest ap]
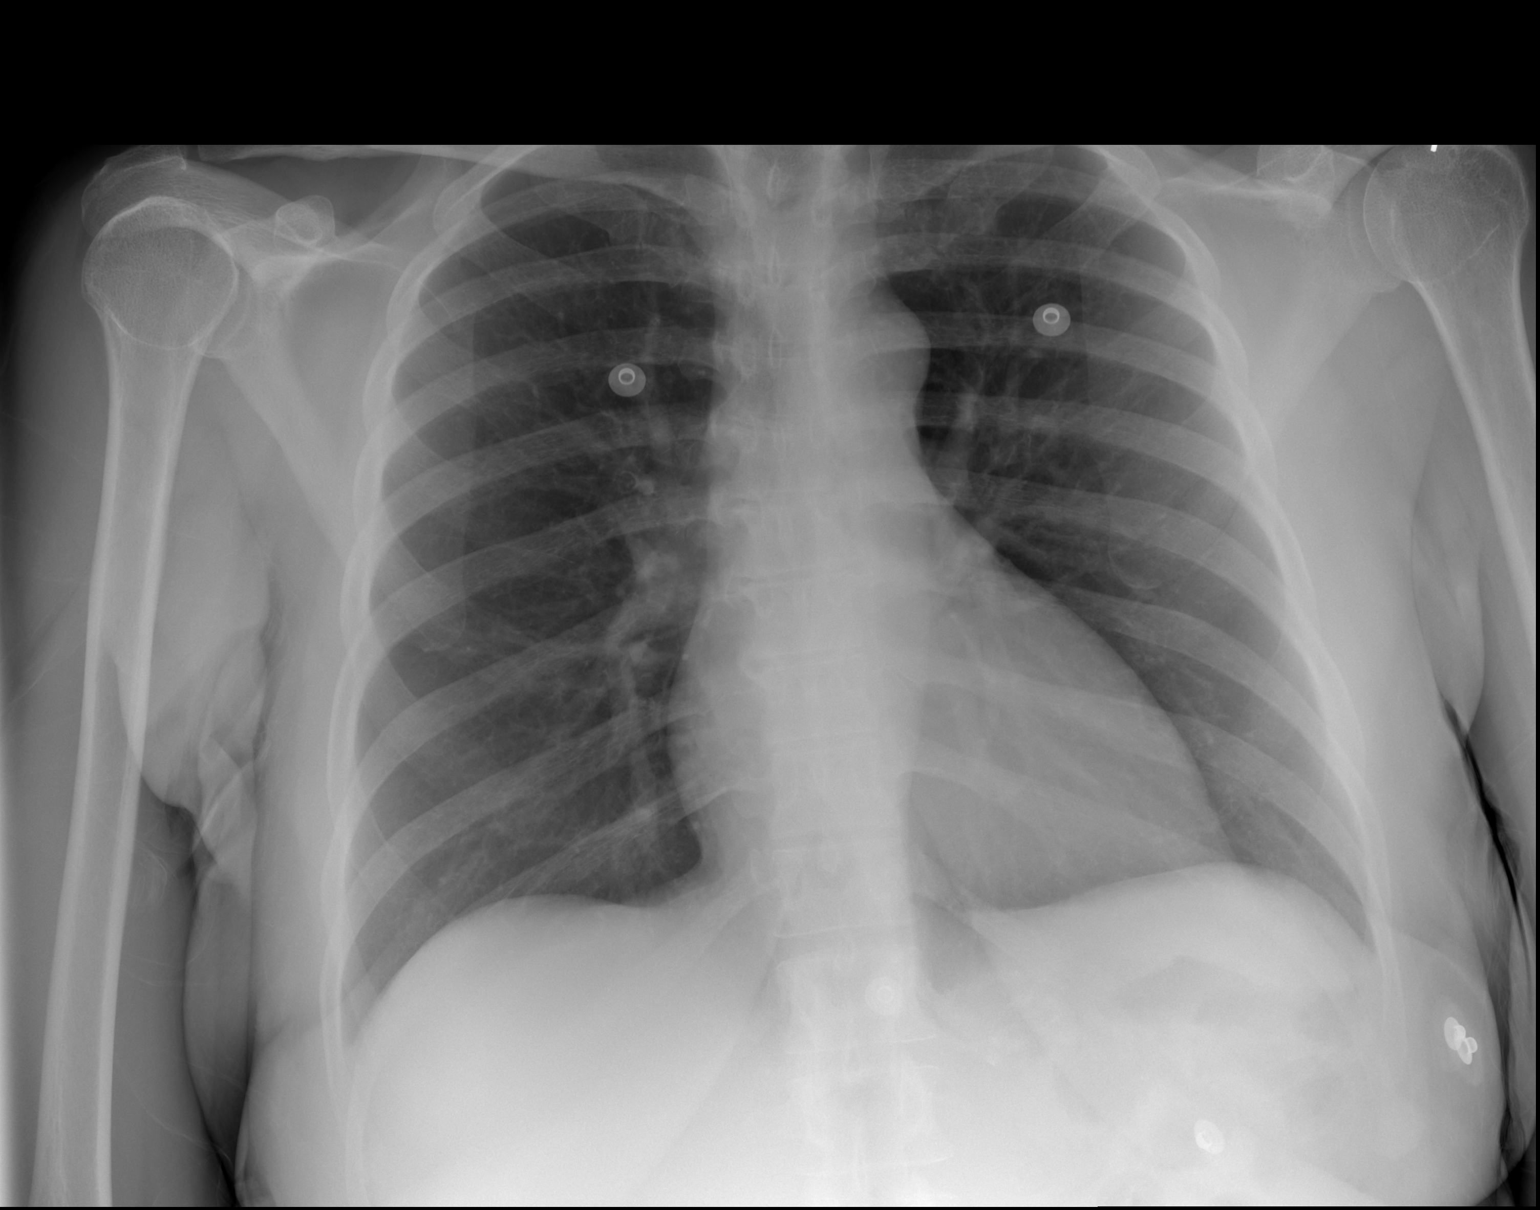

[2 of 2 positions shown; findings below may reference images not displayed]

FINDINGS: Semi upright AP and lateral views of the chest. Normal lung volumes.
Normal cardiac size and mediastinal contours. Visualized tracheal
air column is within normal limits. The lungs are clear. No
pneumothorax or pleural effusion. Stable cholecystectomy clips.
Negative visible bowel gas pattern. No acute osseous abnormality
identified.
IMPRESSION: Negative, no acute cardiopulmonary abnormality.

## 2016-10-03 ENCOUNTER — Ambulatory Visit (INDEPENDENT_AMBULATORY_CARE_PROVIDER_SITE_OTHER)

## 2016-10-03 ENCOUNTER — Ambulatory Visit (INDEPENDENT_AMBULATORY_CARE_PROVIDER_SITE_OTHER): Admitting: Sports Medicine

## 2016-10-03 ENCOUNTER — Encounter: Payer: Self-pay | Admitting: Sports Medicine

## 2016-10-03 VITALS — BP 142/94

## 2016-10-03 DIAGNOSIS — M79672 Pain in left foot: Secondary | ICD-10-CM | POA: Diagnosis not present

## 2016-10-03 DIAGNOSIS — M79671 Pain in right foot: Secondary | ICD-10-CM

## 2016-10-03 DIAGNOSIS — R252 Cramp and spasm: Secondary | ICD-10-CM

## 2016-10-03 DIAGNOSIS — M21619 Bunion of unspecified foot: Secondary | ICD-10-CM

## 2016-10-03 DIAGNOSIS — M729 Fibroblastic disorder, unspecified: Secondary | ICD-10-CM | POA: Diagnosis not present

## 2016-10-03 DIAGNOSIS — M722 Plantar fascial fibromatosis: Secondary | ICD-10-CM

## 2016-10-03 DIAGNOSIS — M2142 Flat foot [pes planus] (acquired), left foot: Secondary | ICD-10-CM

## 2016-10-03 DIAGNOSIS — M2141 Flat foot [pes planus] (acquired), right foot: Secondary | ICD-10-CM

## 2016-10-03 NOTE — Patient Instructions (Signed)
Tonic water at night before bed for cramps Cramp ease OTC for cramps that continue Gentle stretching and icing  Good supportive shoes and insoles

## 2016-10-03 NOTE — Progress Notes (Signed)
Subjective: Cindy Cain is a 56 y.o. female patient presents to office with complaint of cramping to toes and occasion pain in balls that is better with padding. Reports she has cramping each week that is resolved with rest, change of shoes, and with massage and cramp medication/OTC cramp ease. Denies any other pedal complaints.   There are no active problems to display for this patient.   Current Outpatient Prescriptions on File Prior to Visit  Medication Sig Dispense Refill  . CALCIUM PO Take 1 tablet by mouth daily.    Marland Kitchen. EPINEPHrine 0.3 mg/0.3 mL IJ SOAJ injection Inject 0.3 mLs (0.3 mg total) into the muscle once. 1 Device 1  . lisinopril-hydrochlorothiazide (PRINZIDE,ZESTORETIC) 10-12.5 MG per tablet Take 1 tablet by mouth daily.    . meclizine (ANTIVERT) 12.5 MG tablet Take 1 tablet (12.5 mg total) by mouth 3 (three) times daily as needed for dizziness. 30 tablet 0  . Multiple Vitamins-Minerals (MULTIVITAMIN WITH MINERALS) tablet Take 1 tablet by mouth daily.    . lansoprazole (PREVACID) 15 MG capsule Take 15 mg by mouth daily as needed (when taking aleve).     . naproxen sodium (ANAPROX) 220 MG tablet Take 220 mg by mouth daily as needed (pain).    . predniSONE (DELTASONE) 20 MG tablet Take 2 tablets (40 mg total) by mouth daily. (Patient not taking: Reported on 10/03/2016) 10 tablet 0   No current facility-administered medications on file prior to visit.     Allergies  Allergen Reactions  . Aspirin Other (See Comments)    Ulcers   . Codeine Other (See Comments)    ulcers    Objective: Physical Exam General: The patient is alert and oriented x3 in no acute distress.  Dermatology: Skin is warm, dry and supple bilateral lower extremities. Nails 1-10 are normal. There is no erythema, edema, no eccymosis, no open lesions present. Integument is otherwise unremarkable.  Vascular: Dorsalis Pedis pulse and Posterior Tibial pulse are 2/4 bilateral. Capillary fill time is immediate  to all digits.  Neurological: Grossly intact to light touch with an achilles reflex of +2/5 and a  negative Tinel's sign bilateral.  Musculoskeletal: No reproducible tenderness to palpation at the medial calcaneal tubercale and through the insertion of the plantar fascia bilateral. No pain with compression of calcaneus bilateral. No pain with tuning fork to calcaneus bilateral. No pain with calf compression bilateral. There is decreased Ankle joint range of motion bilateral. All other joints range of motion within normal limits bilateral. Pes planus and bunion bilateral. Strength 5/5 in all groups bilateral.   Gait: Unassisted, Antalgic avoid weight on left/right heel  Xray, Right/Left foot:  Normal osseous mineralization. Joint spaces preserved except midfoot where there is a breach suggestive of planus. + Bunion. No fracture/dislocation/boney destruction. Calcaneal spur present with mild thickening of plantar fascia. No other soft tissue abnormalities or radiopaque foreign bodies.   Assessment and Plan: Problem List Items Addressed This Visit    None    Visit Diagnoses    Pain in both feet    -  Primary   Relevant Orders   DG Foot Complete Right (Completed)   DG Foot Complete Left (Completed)   Cramping of feet       Fasciitis       Pes planus of both feet       Bunion          -Complete examination performed.  -Xrays reviewed -Discussed with patient in detail the condition of bunion,  fatigue, pes planus and  plantar fasciitis, how this occurs and general treatment options. Explained both conservative and surgical treatments.  -Patient was casted for custom insoles with paperwork completed for [facility], patient would like to known cost before orthotics are sent out to lab -Recommended good supportive shoes -Tonic water before bed and continue with cramp ease medication  -Explained and dispensed to patient daily stretching exercises. -Recommend patient to ice affected area 1-2x  daily. -Patient to return to office [facility] or sooner if problems or questions arise.  Asencion Islam, DPM

## 2016-12-06 ENCOUNTER — Telehealth: Payer: Self-pay | Admitting: Sports Medicine

## 2016-12-06 NOTE — Telephone Encounter (Signed)
lvm for pt to call to schedule an appt to [facility]..Marland Kitchen

## 2016-12-20 NOTE — Telephone Encounter (Signed)
Pt left a voicemail on my old extension and I just realized it today. I called pt back and it went directly to voicemail and I left her another message with my direct extension.

## 2017-01-11 ENCOUNTER — Ambulatory Visit: Admitting: Orthotics

## 2017-01-11 DIAGNOSIS — M2141 Flat foot [pes planus] (acquired), right foot: Secondary | ICD-10-CM

## 2017-01-11 DIAGNOSIS — M79672 Pain in left foot: Secondary | ICD-10-CM

## 2017-01-11 DIAGNOSIS — M2142 Flat foot [pes planus] (acquired), left foot: Secondary | ICD-10-CM

## 2017-01-11 DIAGNOSIS — R252 Cramp and spasm: Secondary | ICD-10-CM

## 2017-01-11 DIAGNOSIS — M79671 Pain in right foot: Secondary | ICD-10-CM

## 2017-01-11 NOTE — Progress Notes (Signed)
Patient came in today to pick up custom made foot orthotics.  The goals were accomplished and the patient reported no dissatisfaction with said orthotics.  Patient was advised of breakin period and how to report any issues. 

## 2017-07-11 LAB — HEMOGLOBIN A1C: HEMOGLOBIN A1C: 5.1

## 2017-07-11 LAB — LIPID PANEL
Cholesterol: 186 (ref 0–200)
HDL: 65 (ref 35–70)
LDL CALC: 112
LDl/HDL Ratio: 1.7
TRIGLYCERIDES: 44 (ref 40–160)

## 2017-07-11 LAB — BASIC METABOLIC PANEL
BUN: 17 (ref 4–21)
CREATININE: 1.1 (ref 0.5–1.1)
Glucose: 86
Potassium: 4.3 (ref 3.4–5.3)
Sodium: 145 (ref 137–147)

## 2017-08-19 LAB — HM MAMMOGRAPHY: HM Mammogram: NORMAL (ref 0–4)

## 2018-01-23 ENCOUNTER — Encounter: Payer: Self-pay | Admitting: Nurse Practitioner

## 2018-01-23 DIAGNOSIS — Z6835 Body mass index (BMI) 35.0-35.9, adult: Secondary | ICD-10-CM

## 2018-01-23 DIAGNOSIS — M25569 Pain in unspecified knee: Secondary | ICD-10-CM | POA: Insufficient documentation

## 2018-01-23 DIAGNOSIS — R7309 Other abnormal glucose: Secondary | ICD-10-CM

## 2018-01-23 DIAGNOSIS — I10 Essential (primary) hypertension: Secondary | ICD-10-CM

## 2018-01-23 HISTORY — DX: Body mass index (BMI) 35.0-35.9, adult: Z68.35

## 2018-01-23 HISTORY — DX: Pain in unspecified knee: M25.569

## 2018-02-06 ENCOUNTER — Encounter: Admitting: Nurse Practitioner

## 2018-03-18 ENCOUNTER — Other Ambulatory Visit: Payer: Self-pay | Admitting: Internal Medicine

## 2018-03-29 ENCOUNTER — Other Ambulatory Visit: Payer: Self-pay

## 2018-03-29 MED ORDER — LISINOPRIL-HYDROCHLOROTHIAZIDE 10-12.5 MG PO TABS: 1.0000 | ORAL_TABLET | Freq: Every day | ORAL | 0 refills | Status: DC

## 2018-04-02 ENCOUNTER — Encounter: Payer: Self-pay | Admitting: Internal Medicine

## 2018-04-11 ENCOUNTER — Encounter: Payer: Self-pay | Admitting: Internal Medicine

## 2018-04-16 ENCOUNTER — Other Ambulatory Visit (HOSPITAL_COMMUNITY)
Admission: RE | Admit: 2018-04-16 | Discharge: 2018-04-16 | Disposition: A | Discharge status: Home / Self Care | Source: Ambulatory Visit | Attending: Internal Medicine | Admitting: Internal Medicine

## 2018-04-16 ENCOUNTER — Ambulatory Visit (INDEPENDENT_AMBULATORY_CARE_PROVIDER_SITE_OTHER): Admitting: Internal Medicine

## 2018-04-16 ENCOUNTER — Encounter: Payer: Self-pay | Admitting: Internal Medicine

## 2018-04-16 VITALS — BP 116/70 | HR 50 | Temp 98.1°F | Ht 64.0 in | Wt 209.0 lb

## 2018-04-16 DIAGNOSIS — I10 Essential (primary) hypertension: Secondary | ICD-10-CM | POA: Diagnosis not present

## 2018-04-16 DIAGNOSIS — Z1272 Encounter for screening for malignant neoplasm of vagina: Secondary | ICD-10-CM

## 2018-04-16 DIAGNOSIS — Z1212 Encounter for screening for malignant neoplasm of rectum: Secondary | ICD-10-CM

## 2018-04-16 DIAGNOSIS — Z0001 Encounter for general adult medical examination with abnormal findings: Secondary | ICD-10-CM

## 2018-04-16 DIAGNOSIS — R252 Cramp and spasm: Secondary | ICD-10-CM

## 2018-04-16 DIAGNOSIS — R0602 Shortness of breath: Secondary | ICD-10-CM | POA: Diagnosis not present

## 2018-04-16 DIAGNOSIS — R42 Dizziness and giddiness: Secondary | ICD-10-CM

## 2018-04-16 DIAGNOSIS — R001 Bradycardia, unspecified: Secondary | ICD-10-CM

## 2018-04-16 LAB — POCT URINALYSIS DIPSTICK
Bilirubin, UA: NEGATIVE
Blood, UA: NEGATIVE
Glucose, UA: NEGATIVE
Ketones, UA: NEGATIVE
Leukocytes, UA: NEGATIVE
Nitrite, UA: NEGATIVE
Protein, UA: NEGATIVE
Spec Grav, UA: 1.015 (ref 1.010–1.025)
Urobilinogen, UA: 0.2 E.U./dL
pH, UA: 7 (ref 5.0–8.0)

## 2018-04-16 LAB — POCT UA - MICROALBUMIN
Albumin/Creatinine Ratio, Urine, POC: 30
Creatinine, POC: 300 mg/dL
MICROALBUMIN (UR) POC: 30 mg/L

## 2018-04-16 NOTE — Patient Instructions (Signed)
Preventive Care 40-64 Years, Female Preventive care refers to lifestyle choices and visits with your health care provider that can promote health and wellness. What does preventive care include?  A yearly physical exam. This is also called an annual well check.  Dental exams once or twice a year.  Routine eye exams. Ask your health care provider how often you should have your eyes checked.  Personal lifestyle choices, including: ? Daily care of your teeth and gums. ? Regular physical activity. ? Eating a healthy diet. ? Avoiding tobacco and drug use. ? Limiting alcohol use. ? Practicing safe sex. ? Taking low-dose aspirin daily starting at age 73. ? Taking vitamin and mineral supplements as recommended by your health care provider. What happens during an annual well check? The services and screenings done by your health care provider during your annual well check will depend on your age, overall health, lifestyle risk factors, and family history of disease. Counseling Your health care provider may ask you questions about your:  Alcohol use.  Tobacco use.  Drug use.  Emotional well-being.  Home and relationship well-being.  Sexual activity.  Eating habits.  Work and work Statistician.  Method of birth control.  Menstrual cycle.  Pregnancy history.  Screening You may have the following tests or measurements:  Height, weight, and BMI.  Blood pressure.  Lipid and cholesterol levels. These may be checked every 5 years, or more frequently if you are over 26 years old.  Skin check.  Lung cancer screening. You may have this screening every year starting at age 25 if you have a 30-pack-year history of smoking and currently smoke or have quit within the past 15 years.  Fecal occult blood test (FOBT) of the stool. You may have this test every year starting at age 72.  Flexible sigmoidoscopy or colonoscopy. You may have a sigmoidoscopy every 5 years or a colonoscopy  every 10 years starting at age 17.  Hepatitis C blood test.  Hepatitis B blood test.  Sexually transmitted disease (STD) testing.  Diabetes screening. This is done by checking your blood sugar (glucose) after you have not eaten for a while (fasting). You may have this done every 1-3 years.  Mammogram. This may be done every 1-2 years. Talk to your health care provider about when you should start having regular mammograms. This may depend on whether you have a family history of breast cancer.  BRCA-related cancer screening. This may be done if you have a family history of breast, ovarian, tubal, or peritoneal cancers.  Pelvic exam and Pap test. This may be done every 3 years starting at age 88. Starting at age 88, this may be done every 5 years if you have a Pap test in combination with an HPV test.  Bone density scan. This is done to screen for osteoporosis. You may have this scan if you are at high risk for osteoporosis.  Discuss your test results, treatment options, and if necessary, the need for more tests with your health care provider. Vaccines Your health care provider may recommend certain vaccines, such as:  Influenza vaccine. This is recommended every year.  Tetanus, diphtheria, and acellular pertussis (Tdap, Td) vaccine. You may need a Td booster every 10 years.  Varicella vaccine. You may need this if you have not been vaccinated.  Zoster vaccine. You may need this after age 51.  Measles, mumps, and rubella (MMR) vaccine. You may need at least one dose of MMR if you were born in  1956 or later. You may also need a second dose.  Pneumococcal 13-valent conjugate (PCV13) vaccine. You may need this if you have certain conditions and were not previously vaccinated.  Pneumococcal polysaccharide (PPSV23) vaccine. You may need one or two doses if you smoke cigarettes or if you have certain conditions.  Meningococcal vaccine. You may need this if you have certain  conditions.  Hepatitis A vaccine. You may need this if you have certain conditions or if you travel or work in places where you may be exposed to hepatitis A.  Hepatitis B vaccine. You may need this if you have certain conditions or if you travel or work in places where you may be exposed to hepatitis B.  Haemophilus influenzae type b (Hib) vaccine. You may need this if you have certain conditions.  Talk to your health care provider about which screenings and vaccines you need and how often you need them. This information is not intended to replace advice given to you by your health care provider. Make sure you discuss any questions you have with your health care provider. Document Released: 05/21/2015 Document Revised: 01/12/2016 Document Reviewed: 02/23/2015 Elsevier Interactive Patient Education  2018 [employer]  

## 2018-04-16 NOTE — Progress Notes (Signed)
Subjective:     Patient ID: Cindy Cain , female    DOB: 23-May-1960 , 57 y.o.   MRN: [id number]   Chief Complaint  Patient presents with  . Annual Exam    uremia syndrome would like to be tested for this   . Fatigue    has been very tired and loss of energy   . Abdominal Cramping    also leg cramping     HPI  Pt comes in for annual physical. States that in the past few weeks has been having fatigue with minimal activity. Denies chest pain. She used to be able to exercise for 45 min, but now she can barely make it for 10 minutes or if she climbs 2 flight of stairs  Past Medical History:  Diagnosis Date  . Hypertension      Family History  Problem Relation Age of Onset  . COPD Mother   . Hemolytic uremic syndrome Father   . Heart attack Brother   . Diabetes Brother   . Hypertension Brother      Current Outpatient Medications:  .  acetaminophen (TYLENOL) 325 MG tablet, Take by mouth., Disp: , Rfl:  .  Blood Glucose Monitoring Suppl (FIFTY50 GLUCOSE METER 2.0) w/Device KIT, DX: Z98.84. E16.1 Use as instructed, Disp: , Rfl:  .  CALCIUM PO, Take 1 tablet by mouth daily., Disp: , Rfl:  .  calcium-vitamin D (OSCAL WITH D) 500-200 MG-UNIT TABS tablet, Take by mouth., Disp: , Rfl:  .  Cholecalciferol (VITAMIN D3) 125 MCG (5000 UT) CAPS, Take by mouth., Disp: , Rfl:  .  docusate sodium (COLACE) 100 MG capsule, Take by mouth., Disp: , Rfl:  .  EPINEPHrine 0.3 mg/0.3 mL IJ SOAJ injection, Inject 0.3 mLs (0.3 mg total) into the muscle once., Disp: 1 Device, Rfl: 1 .  glucose blood (PRECISION QID TEST) test strip, by Other route two (2) times a day as needed., Disp: , Rfl:  .  lisinopril-hydrochlorothiazide (PRINZIDE,ZESTORETIC) 10-12.5 MG tablet, Take 1 tablet by mouth daily., Disp: 30 tablet, Rfl: 0 .  meclizine (ANTIVERT) 12.5 MG tablet, Take 1 tablet (12.5 mg total) by mouth 3 (three) times daily as needed for dizziness., Disp: 30 tablet, Rfl: 0 .  Multiple Vitamins-Minerals  (MULTIVITAMIN WITH MINERALS) tablet, Take 1 tablet by mouth daily., Disp: , Rfl:  .  Thiamine HCl (VITAMIN B-1) 250 MG tablet, Take by mouth., Disp: , Rfl:    Allergies  Allergen Reactions  . Aspirin Other (See Comments)    Ulcers   . Codeine Other (See Comments)    ulcers     Review of Systems  Constitutional: Positive for appetite change and fatigue. Negative for chills, diaphoresis and unexpected weight change.       Decreased appetite  HENT: Positive for postnasal drip.        From a cold resolving  Eyes: Negative for visual disturbance.  Respiratory: Positive for shortness of breath. Negative for chest tightness.        Dizzy and fatigue, with 10 min of exercise or climbs 2 flights of stairs   Cardiovascular: Negative for chest pain, palpitations and leg swelling.  Gastrointestinal: Positive for constipation. Negative for abdominal pain, blood in stool, diarrhea, nausea, rectal pain and vomiting.       Gets abd  Cramps 2-3 / week on abdominal muscles which is similar to the ones she gets on her thighs. Magnesium helps oral and spray helps.   Endocrine: Negative for cold intolerance,  heat intolerance, polydipsia and polyphagia.  Genitourinary: Positive for frequency. Negative for difficulty urinating, dysuria, pelvic pain, urgency, vaginal bleeding, vaginal discharge and vaginal pain.       Drinks a lot of fluids, therefore has frequency. Has been menopausal since 2008  Musculoskeletal: Negative.   Skin: Negative for color change, pallor, rash and wound.  Allergic/Immunologic: Positive for food allergies. Negative for environmental allergies.       Is lactose intolerant  Neurological: Positive for light-headedness. Negative for dizziness, tremors, seizures, syncope, weakness, numbness and headaches.       Electric pains on L medial ball of foot, has seen podiatry and tried insoles, and has not helped. Has chronic vertigo  Hematological: Negative for adenopathy. Does not  bruise/bleed easily.  Psychiatric/Behavioral: Negative for behavioral problems, confusion, decreased concentration and sleep disturbance. The patient is not nervous/anxious.        Denies depression    EKG- negative Today's Vitals   04/16/18 1229  BP: 116/70  Pulse: (!) 50  Temp: 98.1 F (36.7 C)  TempSrc: Oral  SpO2: 97%  Weight: 209 lb (94.8 kg)  Height: 5' 4"  (1.626 m)   Body mass index is 35.87 kg/m.   Objective:  Physical Exam  BP 116/70 (BP Location: Left Arm, Patient Position: Sitting, Cuff Size: Normal)   Pulse (!) 50   Temp 98.1 F (36.7 C) (Oral)   Ht 5' 4"  (1.626 m)   Wt 209 lb (94.8 kg)   SpO2 97%   BMI 35.87 kg/m   General Appearance:    Alert, cooperative, no distress, appears stated age  Head:    Normocephalic, without obvious abnormality, atraumatic  Eyes:    PERRL, conjunctiva/corneas clear, EOM's intact, fundi    benign, both eyes  Ears:    Normal TM's and external ear canals, both ears  Nose:   Nares normal, septum midline, mucosa normal, no drainage    or sinus tenderness  Throat:   Lips, mucosa, and tongue normal; teeth and gums normal  Neck:   Supple, symmetrical, trachea midline, no adenopathy;    thyroid:  no enlargement/tenderness/nodules; no carotid   bruit  Back:     Symmetric, no curvature, ROM normal, no CVA tenderness  Lungs:     Clear to auscultation bilaterally, respirations unlabored  Chest Wall:    No tenderness or deformity   Heart:    Regular rate and rhythm, S1 and S2 normal, no murmur, rub   or gallop  Breast Exam:    No tenderness, masses, or nipple abnormality  Abdomen:     Soft, non-tender, bowel sounds active all four quadrants,    no masses, no organomegaly  Genitalia:    Normal female without lesion, discharge or tenderness  Rectal:    Normal tone, normal prostate, no masses or tenderness;   guaiac negative stool  Extremities:   Extremities normal, atraumatic, no cyanosis or edema  Pulses:   2+ and symmetric all  extremities  Skin:   Skin color, texture, turgor normal, no rashes or lesions  Lymph nodes:   Cervical, supraclavicular, and axillary nodes normal  Neurologic:   CNII-XII intact, normal strength, upper extremity reflexes    +2/4, patellar +1/4, I was not able to get achillis reflexes bilaterally. Had normal Rhomberg, tanden gait, heel and tip toe gait.    Assessment And Plan:   1. Bradycardia- has had similar rate in the past, but was not symptomatic - EKG 12-Lead - TSH - T3, free - Magnesium  2. Dizziness- acute, related with exertion. - CBC no Diff  3. Encounter for general adult medical examination with abnormal findings- routine. FU 1 y - T4, Free - Lipid Profile - CMP14 + Anion Gap - CBC no Diff - POC Hemoccult Bld/Stl (1-Cd Office Dx)  4. Essential hypertension- stable. May continue same meds.  - POCT UA - Microalbumin - POCT Urinalysis Dipstick (81002)  5. Screening for vaginal cancer- screen. Repeat in 3 years if normal.  - Cytology -Pap Smear  6. Bilateral leg cramps- thigh area, chronic - Magnesium  7. SOB (shortness of breath) on exertion- acute. If labs are neg, I will ref to cardiology and ordered chest xray.     Tyliyah Mcmeekin RODRIGUEZ-SOUTHWORTH, PA-C

## 2018-04-17 LAB — CBC
Hematocrit: 35.8 % (ref 34.0–46.6)
Hemoglobin: 11.3 g/dL (ref 11.1–15.9)
MCH: 25.1 pg — AB (ref 26.6–33.0)
MCHC: 31.6 g/dL (ref 31.5–35.7)
MCV: 79 fL (ref 79–97)
Platelets: 377 10*3/uL (ref 150–450)
RBC: 4.51 x10E6/uL (ref 3.77–5.28)
RDW: 14 % (ref 12.3–15.4)
WBC: 5.5 10*3/uL (ref 3.4–10.8)

## 2018-04-17 LAB — CMP14 + ANION GAP
ALT: 13 IU/L (ref 0–32)
AST: 20 IU/L (ref 0–40)
Albumin/Globulin Ratio: 1.4 (ref 1.2–2.2)
Albumin: 4.3 g/dL (ref 3.5–5.5)
Alkaline Phosphatase: 97 IU/L (ref 39–117)
Anion Gap: 16 mmol/L (ref 10.0–18.0)
BUN/Creatinine Ratio: 14 (ref 9–23)
BUN: 13 mg/dL (ref 6–24)
Bilirubin Total: 0.3 mg/dL (ref 0.0–1.2)
CO2: 24 mmol/L (ref 20–29)
Calcium: 9.5 mg/dL (ref 8.7–10.2)
Chloride: 100 mmol/L (ref 96–106)
Creatinine, Ser: 0.94 mg/dL (ref 0.57–1.00)
GFR calc Af Amer: 78 mL/min/{1.73_m2} (ref >59)
GFR, EST NON AFRICAN AMERICAN: 68 mL/min/{1.73_m2} (ref >59)
Globulin, Total: 3.1 g/dL (ref 1.5–4.5)
Glucose: 84 mg/dL (ref 65–99)
Potassium: 4.3 mmol/L (ref 3.5–5.2)
Sodium: 140 mmol/L (ref 134–144)
Total Protein: 7.4 g/dL (ref 6.0–8.5)

## 2018-04-17 LAB — T3, FREE: T3 FREE: 2.7 pg/mL (ref 2.0–4.4)

## 2018-04-17 LAB — POC HEMOCCULT BLD/STL (OFFICE/1-CARD/DIAGNOSTIC): Fecal Occult Blood, POC: NEGATIVE

## 2018-04-17 LAB — T4, FREE: Free T4: 1.43 ng/dL (ref 0.82–1.77)

## 2018-04-17 LAB — LIPID PANEL
CHOL/HDL RATIO: 3 ratio (ref 0.0–4.4)
Cholesterol, Total: 186 mg/dL (ref 100–199)
HDL: 61 mg/dL (ref >39)
LDL Calculated: 113 mg/dL — ABNORMAL HIGH (ref 0–99)
Triglycerides: 61 mg/dL (ref 0–149)
VLDL Cholesterol Cal: 12 mg/dL (ref 5–40)

## 2018-04-17 LAB — TSH: TSH: 1.04 u[IU]/mL (ref 0.450–4.500)

## 2018-04-17 LAB — MAGNESIUM: Magnesium: 2.3 mg/dL (ref 1.6–2.3)

## 2018-04-18 ENCOUNTER — Other Ambulatory Visit: Payer: Self-pay | Admitting: Internal Medicine

## 2018-04-18 DIAGNOSIS — R0602 Shortness of breath: Secondary | ICD-10-CM

## 2018-04-18 LAB — CYTOLOGY - PAP: Diagnosis: NEGATIVE

## 2018-04-18 NOTE — Progress Notes (Signed)
Please request her last Mammogram from [facility].  Also tell her that I ordered a chest xray which is part of her work up for her fatigue with little fatigue.

## 2018-04-19 ENCOUNTER — Ambulatory Visit (INDEPENDENT_AMBULATORY_CARE_PROVIDER_SITE_OTHER): Admitting: Internal Medicine

## 2018-04-19 ENCOUNTER — Encounter: Payer: Self-pay | Admitting: Internal Medicine

## 2018-04-19 VITALS — BP 118/80 | HR 64 | Ht 65.0 in | Wt 209.0 lb

## 2018-04-19 DIAGNOSIS — R001 Bradycardia, unspecified: Secondary | ICD-10-CM

## 2018-04-19 DIAGNOSIS — Z8249 Family history of ischemic heart disease and other diseases of the circulatory system: Secondary | ICD-10-CM | POA: Diagnosis not present

## 2018-04-19 DIAGNOSIS — R0602 Shortness of breath: Secondary | ICD-10-CM

## 2018-04-19 NOTE — Progress Notes (Signed)
OFFICE CONSULT NOTE  Chief Complaint:  Dyspnea on exertion  Primary Care Physician: Cindy Chard, MD  HPI:  Cindy Cain is a 57 y.o. female who is being seen today for the evaluation of dyspnea on exertion at the request of Cindy Cain, S*.  This is a pleasant 57 year old female kindly referred for evaluation of dyspnea.  She has a history of hypertension and family history of heart disease in her brother who died of an MI at age 31.  This was actually a complication of end-stage renal disease and she has another brother and her father who also died of end-stage renal disease/uremia.  She reports shortness of breath that starts after activity.  In addition recently she has had a low heart rate.  It was 49 but is better today.  She does not regularly monitor her heart rate.  She denies any alcohol tobacco or drug use.  She has been doing exercise particular cardio about 3 to 4 days a week for 30 to 45 minutes.  EKG shows sinus rhythm without ischemia today at 64.  PMHx:  Past Medical History:  Diagnosis Date  . Hypertension     Past Surgical History:  Procedure Laterality Date  . ABDOMINAL HYSTERECTOMY     partial, secondary to bleeding and anemia  . APPENDECTOMY    . CHOLECYSTECTOMY    . GASTRIC BYPASS      FAMHx:  Family History  Problem Relation Age of Onset  . COPD Mother   . Hemolytic uremic syndrome Father   . Heart attack Brother   . Diabetes Brother   . Hypertension Brother     SOCHx:   reports that she has never smoked. She has never used smokeless tobacco. She reports that she does not drink alcohol or use drugs.  ALLERGIES:  Allergies  Allergen Reactions  . Aspirin Other (See Comments)    Ulcers   . Codeine Other (See Comments)    ulcers    ROS: Pertinent items noted in HPI and remainder of comprehensive ROS otherwise negative.  HOME MEDS: Current Outpatient Medications on File Prior to Visit  Medication Sig Dispense Refill  .  acetaminophen (TYLENOL) 325 MG tablet Take by mouth.    . Blood Glucose Monitoring Suppl (FIFTY50 GLUCOSE METER 2.0) w/Device KIT DX: Z98.84. E16.1 Use as instructed    . CALCIUM PO Take 1 tablet by mouth daily.    . calcium-vitamin D (OSCAL WITH D) 500-200 MG-UNIT TABS tablet Take by mouth.    . Cholecalciferol (VITAMIN D3) 125 MCG (5000 UT) CAPS Take by mouth.    . docusate sodium (COLACE) 100 MG capsule Take by mouth.    Marland Kitchen glucose blood (PRECISION QID TEST) test strip by Other route two (2) times a day as needed.    Marland Kitchen lisinopril-hydrochlorothiazide (PRINZIDE,ZESTORETIC) 10-12.5 MG tablet Take 1 tablet by mouth daily. 30 tablet 0  . meclizine (ANTIVERT) 12.5 MG tablet Take 1 tablet (12.5 mg total) by mouth 3 (three) times daily as needed for dizziness. 30 tablet 0  . Multiple Vitamins-Minerals (MULTIVITAMIN WITH MINERALS) tablet Take 1 tablet by mouth daily.    . Thiamine HCl (VITAMIN B-1) 250 MG tablet Take by mouth.     No current facility-administered medications on file prior to visit.     LABS/IMAGING: No results found for this or any previous visit (from the past 48 hour(s)). No results found.  LIPID PANEL:    Component Value Date/Time   CHOL 186 04/16/2018 1405  TRIG 61 04/16/2018 1405   HDL 61 04/16/2018 1405   CHOLHDL 3.0 04/16/2018 1405   LDLCALC 113 (H) 04/16/2018 1405    WEIGHTS: Wt Readings from Last 3 Encounters:  04/19/18 209 lb (94.8 kg)  04/16/18 209 lb (94.8 kg)  07/11/17 205 lb 8 oz (93.2 kg)    VITALS: BP 118/80   Pulse 64   Ht _0  (1.651 m)   Wt 209 lb (94.8 kg)   BMI 34.78 kg/m   EXAM: General appearance: alert and no distress Neck: no carotid bruit, no JVD and thyroid not enlarged, symmetric, no tenderness/mass/nodules Lungs: clear to auscultation bilaterally Heart: regular rate and rhythm Abdomen: soft, non-tender; bowel sounds normal; no masses,  no organomegaly Extremities: extremities normal, atraumatic, no cyanosis or edema Pulses:  2+ and symmetric Skin: Skin color, texture, turgor normal. No rashes or lesions Neurologic: Grossly normal Psych: Pleasant  EKG: Normal sinus rhythm with sinus arrhythmia 64- personally reviewed  ASSESSMENT: 1. Dyspnea on exertion 2. Hypertension 3. Family history of premature coronary disease  PLAN: 1.   Cindy Cain is describing dyspnea on exertion.  She has a history of hypertension family history of premature coronary disease.  Recently had bradycardia which is unexplained however heart rate is improved today.  I like for her to undergo exercise perfusion study.  This will help [place] both assess for chronotropic incompetence as well as coronary perfusion.  Plan follow-up with me afterwards.  Thanks for the kind referral.  Pixie Casino, MD, FACC, [place] Director of the [facility] Diplomate of the American Board of Clinical Lipidology Attending Cardiologist  Direct Dial: [phone]  Fax: [phone]  Website:  [email or web address]  [name]  04/19/2018, 1:22 PM

## 2018-04-19 NOTE — Patient Instructions (Addendum)
Medication Instructions:  Continue current medications If you need a refill on your cardiac medications before your next appointment, please call your pharmacy.   Testing/Procedures: Dr. Rennis GoldenHilty has ordered a Myocardial Perfusion Imaging Study.   The test will take approximately 3 to 4 hours to complete; you may bring reading material.  If someone comes with you to your appointment, they will need to remain in the main lobby due to limited space in the testing area.    How to prepare for your Myocardial Perfusion Test:  Do not eat or drink 3 hours prior to your test, except you may have water.  Do not consume products containing caffeine (regular or decaffeinated) 12 hours prior to your test. (ex: coffee, chocolate, sodas, tea).  Do wear comfortable clothes (no dresses or overalls) and walking shoes, tennis shoes preferred (No heels or open toe shoes are allowed).  Do NOT wear cologne, perfume, aftershave, or lotions (deodorant is allowed).  If these instructions are not followed, your test will have to be rescheduled.   Follow-Up: At [facility], you and your health needs are our priority.  As part of our continuing mission to provide you with exceptional heart care, we have created designated Provider Care Teams.  These Care Teams include your primary Cardiologist (physician) and Advanced Practice Providers (APPs -  Physician Assistants and Nurse Practitioners) who all work together to provide you with the care you need, when you need it. You will need a follow up appointment in 4 weeks. You may see Dr. Rennis GoldenHilty or one of the following Advanced Practice Providers on your designated Care Team: Azalee CourseHao Meng, [place]PA-[place] . Micah FlesherAngela Duke, PA-C  Any Other Special Instructions Will Be Listed Below (If Applicable).

## 2018-04-19 NOTE — Progress Notes (Signed)
We already have it its from 2017 so she is do for one. LMTCB for patient

## 2018-04-23 ENCOUNTER — Other Ambulatory Visit: Payer: Self-pay | Admitting: Internal Medicine

## 2018-04-23 DIAGNOSIS — Z1239 Encounter for other screening for malignant neoplasm of breast: Secondary | ICD-10-CM

## 2018-05-07 ENCOUNTER — Encounter (HOSPITAL_COMMUNITY)

## 2018-05-23 ENCOUNTER — Ambulatory Visit: Admitting: Internal Medicine

## 2018-07-09 ENCOUNTER — Ambulatory Visit (INDEPENDENT_AMBULATORY_CARE_PROVIDER_SITE_OTHER): Admitting: Internal Medicine

## 2018-07-09 ENCOUNTER — Other Ambulatory Visit: Payer: Self-pay

## 2018-07-09 ENCOUNTER — Encounter: Payer: Self-pay | Admitting: Internal Medicine

## 2018-07-09 ENCOUNTER — Ambulatory Visit: Admitting: Internal Medicine

## 2018-07-09 VITALS — BP 106/60 | HR 55 | Temp 98.2°F | Ht 63.8 in | Wt 213.4 lb

## 2018-07-09 DIAGNOSIS — M7062 Trochanteric bursitis, left hip: Secondary | ICD-10-CM | POA: Diagnosis not present

## 2018-07-09 MED ORDER — DICLOFENAC SODIUM 1 % TD GEL: 2.0000 g | Freq: Four times a day (QID) | TRANSDERMAL | 0 refills | Status: AC

## 2018-07-09 NOTE — Progress Notes (Signed)
Subjective:     Patient ID: Cindy Cain , female    DOB: 09/06/1960 , 57 y.o.   MRN: [id number]   Chief Complaint  Patient presents with  . Hip Pain    at night-feels deep- a month-    HPI  Has been having L hip pains x 1 month. Worse qhs when she lays on this side. She sleeps on her sides but had slept more on her L. Denies injuring herself in any way. Denies back pain or numbness down her legs.   Past Medical History:  Diagnosis Date  . Hypertension      Family History  Problem Relation Age of Onset  . COPD Mother   . Hemolytic uremic syndrome Father   . Heart attack Brother   . Diabetes Brother   . Hypertension Brother      Current Outpatient Medications:  .  acetaminophen (TYLENOL) 325 MG tablet, Take by mouth., Disp: , Rfl:  .  Blood Glucose Monitoring Suppl (FIFTY50 GLUCOSE METER 2.0) w/Device KIT, DX: Z98.84. E16.1 Use as instructed, Disp: , Rfl:  .  CALCIUM PO, Take 1 tablet by mouth daily., Disp: , Rfl:  .  Cholecalciferol (VITAMIN D3) 125 MCG (5000 UT) CAPS, Take by mouth., Disp: , Rfl:  .  docusate sodium (COLACE) 100 MG capsule, Take by mouth., Disp: , Rfl:  .  glucose blood (PRECISION QID TEST) test strip, by Other route two (2) times a day as needed., Disp: , Rfl:  .  lisinopril-hydrochlorothiazide (PRINZIDE,ZESTORETIC) 10-12.5 MG tablet, Take 1 tablet by mouth daily., Disp: 30 tablet, Rfl: 0 .  meclizine (ANTIVERT) 12.5 MG tablet, Take 1 tablet (12.5 mg total) by mouth 3 (three) times daily as needed for dizziness., Disp: 30 tablet, Rfl: 0 .  Thiamine HCl (VITAMIN B-1) 250 MG tablet, Take by mouth., Disp: , Rfl:  .  calcium-vitamin D (OSCAL WITH D) 500-200 MG-UNIT TABS tablet, Take by mouth., Disp: , Rfl:  .  Multiple Vitamins-Minerals (MULTIVITAMIN WITH MINERALS) tablet, Take 1 tablet by mouth daily., Disp: , Rfl:    Allergies  Allergen Reactions  . Aspirin Other (See Comments)    Ulcers   . Codeine Other (See Comments)    ulcers     Review of  Systems  Constitutional: Negative for fever.  Genitourinary: Negative for difficulty urinating.  Musculoskeletal: Positive for arthralgias and myalgias. Negative for gait problem, joint swelling, neck pain and neck stiffness.       Has myalgia on L lateral thigh.  Skin: Negative for rash and wound.     Today's Vitals   07/09/18 1133  BP: 106/60  Pulse: (!) 55  Temp: 98.2 F (36.8 C)  TempSrc: Oral  SpO2: 98%  Weight: 213 lb 6.4 oz (96.8 kg)  Height: 5' 3.8" (1.621 m)   Body mass index is 36.86 kg/m.   Objective:  Physical Exam Vitals signs and nursing note reviewed.  Constitutional:      General: She is not in acute distress.    Appearance: Normal appearance. She is not toxic-appearing.  HENT:     Right Ear: External ear normal.     Left Ear: External ear normal.     Nose: Nose normal.  Eyes:     General: No scleral icterus.    Conjunctiva/sclera: Conjunctivae normal.  Neck:     Musculoskeletal: Neck supple.  Pulmonary:     Effort: Pulmonary effort is normal.  Musculoskeletal: Normal range of motion.          General: Tenderness present. No swelling, deformity or signs of injury.     Right lower leg: No edema.     Left lower leg: No edema.     Comments: HIPS- leg length is symmetric, and has no pain with ROM of hips. But she is very tender on her L hip  trochanter area.  Skin:    General: Skin is warm and dry.     Findings: No bruising, erythema, lesion or rash.  Neurological:     Mental Status: She is alert and oriented to person, place, and time.     Motor: No weakness.     Coordination: Coordination normal.     Gait: Gait normal.     Deep Tendon Reflexes: Reflexes normal.  Psychiatric:        Mood and Affect: Mood normal.        Behavior: Behavior normal.        Thought Content: Thought content normal.        Judgment: Judgment normal.    Assessment And Plan:   1. Trochanteric bursitis of left hip- I placed her on Voltaren gel. Advised to apply ice on  area of pain( see instructions) Fu in 2-3 weeks.      Kristapher [place] RODRIGUEZ-SOUTHWORTH, PA-C

## 2018-07-09 NOTE — Patient Instructions (Addendum)
  Use ice on area of pain for 15 minutes 2-3 times a day for 7 days to help inflammation. Avoid sleeping on this side. Try the anti-inflammatory called Mobic for 7 days.  Come back in 2-3 weeks for follow up if you are not better, we may have to send you to physical therapy, consider steroid injection in the area or send you to orthopedic specialist.   Hip Bursitis  Hip bursitis is swelling of a fluid-filled sac (bursa) in your hip. This swelling (inflammation) can be painful. This condition may come and go over time. Follow these instructions at home: Medicines  Take over-the-counter and prescription medicines only as told by your doctor.  Do not drive or use heavy machinery while taking prescription pain medicine, or as told by your doctor.  If you were prescribed an antibiotic medicine, take it as told by your doctor. Do not stop taking the antibiotic even if you start to feel better. Activity  Return to your normal activities as told by your doctor. Ask your doctor what activities are safe for you.  Rest and protect your hip until you feel better. General instructions  Wear wraps that put pressure on your hip (compression wraps) only as told by your doctor.  Raise (elevate) your hip above the level of your heart as much as you can. To do this, try putting a pillow under your hips while you lie down. Stop if this causes pain.  Do not use your hip to support your body weight until your doctor says that you can.  Use crutches as told by your doctor.  Gently rub and stretch your injured area as often as is comfortable.  Keep all follow-up visits as told by your doctor. This is important. How is this prevented?  Exercise regularly, as told by your doctor.  Warm up and stretch before being active.  Cool down and stretch after being active.  Avoid activities that bother your hip or cause pain.  Avoid sitting down for long periods at a time. Contact a doctor if:  You have a  fever.  You get new symptoms.  You have trouble walking.  You have trouble doing everyday activities.  You have pain that gets worse.  You have pain that does not get better with medicine.  You get red skin on your hip area.  You get a feeling of warmth in your hip area. Get help right away if:  You cannot move your hip.  You have very bad pain. This information is not intended to replace advice given to you by your health care provider. Make sure you discuss any questions you have with your health care provider. Document Released: 05/27/2010 Document Revised: 09/30/2015 Document Reviewed: 11/24/2014 Elsevier Interactive Patient Education  2019 [employer].

## 2018-07-24 ENCOUNTER — Other Ambulatory Visit: Payer: Self-pay

## 2018-07-24 MED ORDER — LISINOPRIL-HYDROCHLOROTHIAZIDE 10-12.5 MG PO TABS: 1.0000 | ORAL_TABLET | Freq: Every day | ORAL | 0 refills | Status: DC

## 2018-08-22 ENCOUNTER — Other Ambulatory Visit: Payer: Self-pay

## 2018-08-22 ENCOUNTER — Ambulatory Visit (INDEPENDENT_AMBULATORY_CARE_PROVIDER_SITE_OTHER): Admitting: Nurse Practitioner

## 2018-08-22 ENCOUNTER — Encounter: Payer: Self-pay | Admitting: Nurse Practitioner

## 2018-08-22 VITALS — BP 106/74 | HR 53 | Temp 97.8°F | Wt 213.4 lb

## 2018-08-22 DIAGNOSIS — I10 Essential (primary) hypertension: Secondary | ICD-10-CM | POA: Diagnosis not present

## 2018-08-22 DIAGNOSIS — G4700 Insomnia, unspecified: Secondary | ICD-10-CM

## 2018-08-22 MED ORDER — TRAZODONE HCL 50 MG PO TABS: 50.0000 mg | ORAL_TABLET | Freq: Every evening | ORAL | 2 refills | Status: DC | PRN

## 2018-08-22 MED ORDER — LISINOPRIL-HYDROCHLOROTHIAZIDE 10-12.5 MG PO TABS: 1.0000 | ORAL_TABLET | Freq: Every day | ORAL | 1 refills | Status: DC

## 2018-08-22 NOTE — Progress Notes (Signed)
Virtual Visit via Video Note   This visit type was conducted due to national recommendations for restrictions regarding the COVID-19 Pandemic (e.g. social distancing) in an effort to limit this patient's exposure and mitigate transmission in our community.  Due to her co-morbid illnesses, this patient is at least at moderate risk for complications without adequate follow up.  This format is felt to be most appropriate for this patient at this time.  All issues noted in this document were discussed and addressed.  A limited physical exam was performed with this format.  Please refer to the patient's chart for her consent to telehealth for [facility].  Evaluation Performed:  Follow-up visit  This visit type was conducted due to national recommendations for restrictions regarding the COVID-19 Pandemic (e.g. social distancing).  This format is felt to be most appropriate for this patient at this time.  All issues noted in this document were discussed and addressed.  No physical exam was performed (except for noted visual exam findings with Video Visits).  Please refer to the patient's chart (MyChart message for video visits and phone note for telephone visits) for the patient's consent to telehealth for [facility].  Date:  08/22/2018   ID:  Cindy Cain, DOB 09-25-1960, MRN [id number]  Patient Location:  Home  Provider location:   Office   Chief Complaint:  hypertension  History of Present Illness:    Cindy Cain is a 58 y.o. female who presents via video conferencing for a telehealth visit today.    The patient does not have symptoms concerning for COVID-19 infection (fever, chills, cough, or new shortness of breath).   She is taking 1-2 tylenol pm and 2 melatonin per night. She is having restless sleep. Goes to sleep at 3am most nights.  She sleeps with the rain app but not working anymore.  No caffeine, does not eat meat.  Tries to stay away from sugar.  She does not sleep with TV on at  night.  She is having night sweats.    Hypertension  This is a chronic problem. The current episode started more than 1 year ago. The problem is unchanged. The problem is controlled. Associated symptoms include malaise/fatigue. Pertinent negatives include no anxiety, headaches or shortness of breath. There are no associated agents to hypertension. There are no known risk factors for coronary artery disease. Past treatments include ACE inhibitors and diuretics. There are no compliance problems.  There is no history of angina. There is no history of chronic renal disease.  Insomnia  Primary symptoms: fragmented sleep, malaise/fatigue.  The onset quality is gradual. The problem occurs nightly. The treatment provided no relief. Typical bedtime:  Other.  PMH includes: associated symptoms present, hypertension, no depression. Prior diagnostic workup includes:  No prior workup.     Past Medical History:  Diagnosis Date   Hypertension    Past Surgical History:  Procedure Laterality Date   ABDOMINAL HYSTERECTOMY     partial, secondary to bleeding and anemia   APPENDECTOMY     CHOLECYSTECTOMY     GASTRIC BYPASS       Current Meds  Medication Sig   acetaminophen (TYLENOL) 325 MG tablet Take by mouth.   Blood Glucose Monitoring Suppl (FIFTY50 GLUCOSE METER 2.0) w/Device KIT DX: Z98.84. E16.1 Use as instructed   CALCIUM PO Take 1 tablet by mouth daily.   Cholecalciferol (VITAMIN D3) 125 MCG (5000 UT) CAPS Take by mouth.   docusate sodium (COLACE) 100 MG capsule Take  by mouth.   glucose blood (PRECISION QID TEST) test strip by Other route two (2) times a day as needed.   lisinopril-hydrochlorothiazide (PRINZIDE,ZESTORETIC) 10-12.5 MG tablet Take 1 tablet by mouth daily.   meclizine (ANTIVERT) 12.5 MG tablet Take 1 tablet (12.5 mg total) by mouth 3 (three) times daily as needed for dizziness.   Multiple Vitamins-Minerals (MULTIVITAMIN WITH MINERALS) tablet Take 1 tablet by mouth  daily.   Thiamine HCl (VITAMIN B-1) 250 MG tablet Take by mouth.     Allergies:   Aspirin and Codeine   Social History   Tobacco Use   Smoking status: Never Smoker   Smokeless tobacco: Never Used  Substance Use Topics   Alcohol use: No   Drug use: No     Family Hx: The patient's family history includes COPD in her mother; Diabetes in her brother; Heart attack in her brother; Hemolytic uremic syndrome in her father; Hypertension in her brother.  ROS:   Please see the history of present illness.    Review of Systems  Constitutional: Positive for malaise/fatigue.  Respiratory: Negative for cough and shortness of breath.   Cardiovascular: Negative.   Neurological: Negative for dizziness and headaches.  Psychiatric/Behavioral: Negative for depression. The patient has insomnia. The patient is not nervous/anxious.        Insomnia     All other systems reviewed and are negative.   Labs/Other Tests and Data Reviewed:    Recent Labs: 04/16/2018: ALT 13; BUN 13; Creatinine, Ser 0.94; Hemoglobin 11.3; Magnesium 2.3; Platelets 377; Potassium 4.3; Sodium 140; TSH 1.040   Recent Lipid Panel Lab Results  Component Value Date/Time   CHOL 186 04/16/2018 02:05 PM   TRIG 61 04/16/2018 02:05 PM   HDL 61 04/16/2018 02:05 PM   CHOLHDL 3.0 04/16/2018 02:05 PM   LDLCALC 113 (H) 04/16/2018 02:05 PM    Wt Readings from Last 3 Encounters:  08/22/18 213 lb 6.4 oz (96.8 kg)  07/09/18 213 lb 6.4 oz (96.8 kg)  04/19/18 209 lb (94.8 kg)     Exam:    Vital Signs:  BP 106/74    Pulse (!) 53    Temp 97.8 F (36.6 C) (Oral)    Wt 213 lb 6.4 oz (96.8 kg)    BMI 36.86 kg/m     Physical Exam  Constitutional: She is oriented to person, place, and time and well-developed, well-nourished, and in no distress.  Pulmonary/Chest: Effort normal. No respiratory distress.  Neurological: She is alert and oriented to person, place, and time. No cranial nerve deficit. GCS score is 15.  Skin: No  pallor.  Psychiatric: Mood, memory, affect and judgment normal.    ASSESSMENT & PLAN:    1. Insomnia, unspecified type  Chronic, worsening where she is having to take 2 Tylenol PM and 2 melatonin  Will try Trazodone as needed follow up in 6 weeks. - traZODone (DESYREL) 50 MG tablet; Take 1 tablet (50 mg total) by mouth at bedtime as needed for sleep.  Dispense: 30 tablet; Refill: 2  2. Essential hypertension  B/P is well controlled.   No labs this visit done in 04/2018 within normal limits  The importance of regular exercise and dietary modification was stressed to the patient.   Stressed importance of losing ten percent of her body weight to help with B/P control.   The weight loss would help with decreasing cardiac and cancer risk as well.  - lisinopril-hydrochlorothiazide (PRINZIDE,ZESTORETIC) 10-12.5 MG tablet; Take 1 tablet by mouth daily.  Dispense: 90 tablet; Refill: 1   COVID-19 Education: The signs and symptoms of COVID-19 were discussed with the patient and how to seek care for testing (follow up with PCP or arrange E-visit).  The importance of social distancing was discussed today.  Patient Risk:   After full review of this patients clinical status, I feel that they are at least moderate risk at this time.  Time:   Today, I have spent 17 minutes with the patient with telehealth technology discussing above diagnoses     Medication Adjustments/Labs and Tests Ordered: Current medicines are reviewed at length with the patient today.  Concerns regarding medicines are outlined above.  Tests Ordered: No orders of the defined types were placed in this encounter.  Medication Changes: No orders of the defined types were placed in this encounter.   Disposition:  Follow up in 6 week(s)  Signed, [name], FNP

## 2018-09-02 ENCOUNTER — Telehealth: Payer: Self-pay

## 2018-09-02 NOTE — Telephone Encounter (Signed)
Pt gave verbal ok to do virtual visit

## 2018-09-04 ENCOUNTER — Ambulatory Visit (INDEPENDENT_AMBULATORY_CARE_PROVIDER_SITE_OTHER): Admitting: Nurse Practitioner

## 2018-09-04 ENCOUNTER — Other Ambulatory Visit: Payer: Self-pay

## 2018-09-04 ENCOUNTER — Encounter: Payer: Self-pay | Admitting: Nurse Practitioner

## 2018-09-04 VITALS — BP 106/75 | HR 73 | Temp 97.8°F | Wt 211.6 lb

## 2018-09-04 DIAGNOSIS — M7062 Trochanteric bursitis, left hip: Secondary | ICD-10-CM | POA: Diagnosis not present

## 2018-09-04 NOTE — Progress Notes (Signed)
Virtual Visit via Video ([email or web address])    This visit type was conducted due to national recommendations for restrictions regarding the COVID-19 Pandemic (e.g. social distancing) in an effort to limit this patient's exposure and mitigate transmission in our community.  Patients identity confirmed using two different identifiers.  This format is felt to be most appropriate for this patient at this time.  All issues noted in this document were discussed and addressed.  No physical exam was performed (except for noted visual exam findings with Video Visits).    Date:  09/04/2018   ID:  Cindy Cain, DOB Dec 09, 1960, MRN [id number]  Patient Location:  Home - [place]  Provider location:   Office    Chief Complaint:  Hip pain  History of Present Illness:    Cindy Cain is a 59 y.o. female who presents via video conferencing for a telehealth visit today.    The patient does not have symptoms concerning for COVID-19 infection (fever, chills, cough, or new shortness of breath).   Has been an increase in the pain to her left hip.  She has been using a pain cream. Wakes her up at night.  She was unable to get comfortable. Began getting worse a couple weeks ago.  Unsure of exactly when it started.  No difficulty with walking.  Mostly occurs at night. Also had radiating pain up and down the leg.  Denies any falls.   Hip Pain   The incident occurred more than 1 week ago. The pain is present in the left hip. The quality of the pain is described as aching. She reports no foreign bodies present. Treatments tried: pain cream.     Past Medical History:  Diagnosis Date  . Hypertension    Past Surgical History:  Procedure Laterality Date  . ABDOMINAL HYSTERECTOMY     partial, secondary to bleeding and anemia  . APPENDECTOMY    . CHOLECYSTECTOMY    . GASTRIC BYPASS       No outpatient medications have been marked as taking for the 09/04/18 encounter (Office Visit) with Arnette Felts, FNP.     Allergies:   Aspirin and Codeine   Social History   Tobacco Use  . Smoking status: Never Smoker  . Smokeless tobacco: Never Used  Substance Use Topics  . Alcohol use: No  . Drug use: No     Family Hx: The patient's family history includes COPD in her mother; Diabetes in her brother; Heart attack in her brother; Hemolytic uremic syndrome in her father; Hypertension in her brother.  ROS:   Please see the history of present illness.    Review of Systems  Respiratory: Negative.   Cardiovascular: Negative.   Musculoskeletal: Positive for joint pain and myalgias.       Left hip pain    All other systems reviewed and are negative.   Labs/Other Tests and Data Reviewed:    Recent Labs: 04/16/2018: ALT 13; BUN 13; Creatinine, Ser 0.94; Hemoglobin 11.3; Magnesium 2.3; Platelets 377; Potassium 4.3; Sodium 140; TSH 1.040   Recent Lipid Panel Lab Results  Component Value Date/Time   CHOL 186 04/16/2018 02:05 PM   TRIG 61 04/16/2018 02:05 PM   HDL 61 04/16/2018 02:05 PM   CHOLHDL 3.0 04/16/2018 02:05 PM   LDLCALC 113 (H) 04/16/2018 02:05 PM    Wt Readings from Last 3 Encounters:  09/04/18 211 lb 9.6 oz (96 kg)  08/22/18 213 lb 6.4 oz (96.8 kg)  07/09/18 213 lb  6.4 oz (96.8 kg)     Exam:    Vital Signs:  BP 106/75   Pulse 73   Temp 97.8 F (36.6 C) (Oral)   Wt 211 lb 9.6 oz (96 kg)   BMI 36.55 kg/m     Physical Exam  Constitutional: She is well-developed, well-nourished, and in no distress.  Psychiatric: Mood, memory, affect and judgment normal.    ASSESSMENT & PLAN:    1. Trochanteric bursitis of left hip  Will check xray of hip  Encouraged to take tylenol twice a day for 3-5 days - DG Hip Unilat W OR W/O Pelvis 2-3 Views Left; Future   COVID-19 Education: The signs and symptoms of COVID-19 were discussed with the patient and how to seek care for testing (follow up with PCP or arrange E-visit).  The importance of social distancing was discussed today.   Patient Risk:   After full review of this patients clinical status, I feel that they are at least moderate risk at this time.  Time:   Today, I have spent 15 minutes/ seconds with the patient with telehealth technology discussing above diagnoses.     Medication Adjustments/Labs and Tests Ordered: Current medicines are reviewed at length with the patient today.  Concerns regarding medicines are outlined above.   Tests Ordered: No orders of the defined types were placed in this encounter.   Medication Changes: No orders of the defined types were placed in this encounter.   Disposition:  Follow up prn  Signed, Arnette FeltsJanece Yarielys Beed, FNP

## 2018-09-05 ENCOUNTER — Other Ambulatory Visit: Payer: Self-pay

## 2018-09-05 ENCOUNTER — Ambulatory Visit
Admission: RE | Admit: 2018-09-05 | Discharge: 2018-09-05 | Disposition: A | Discharge status: Home / Self Care | Source: Ambulatory Visit | Attending: Nurse Practitioner | Admitting: Nurse Practitioner

## 2018-09-05 DIAGNOSIS — M7062 Trochanteric bursitis, left hip: Secondary | ICD-10-CM

## 2018-09-06 ENCOUNTER — Telehealth: Payer: Self-pay

## 2018-09-06 NOTE — Telephone Encounter (Signed)
Called pt to ask her if she is willing to take the a temporary steroid and she said no she doesn't want to take that if there is nothing wrong she stated she just wants to figure out what is going on I told her we could send her to PT and she stated she does not want to say she wondered if she needed a MRI. I told her the pain could be just be from her getting older because the Xray was negative. Patient understood but is still insists that there is something wrong. YRL,RMA

## 2018-09-09 ENCOUNTER — Other Ambulatory Visit: Payer: Self-pay

## 2018-09-09 ENCOUNTER — Telehealth: Payer: Self-pay

## 2018-09-09 DIAGNOSIS — S83222D Peripheral tear of medial meniscus, current injury, left knee, subsequent encounter: Secondary | ICD-10-CM

## 2018-09-09 DIAGNOSIS — M7062 Trochanteric bursitis, left hip: Secondary | ICD-10-CM

## 2018-09-09 NOTE — Telephone Encounter (Signed)
Spoke with pt and she would like to see the orthopedic specialist

## 2018-09-09 NOTE — Telephone Encounter (Signed)
We can refer her to orthopedics for further evaluation is she is interested.

## 2018-09-09 NOTE — Telephone Encounter (Signed)
Left pt v/m to call office. YRL,RMA 

## 2018-09-11 ENCOUNTER — Other Ambulatory Visit: Payer: Self-pay

## 2018-09-20 ENCOUNTER — Encounter: Payer: Self-pay | Admitting: Internal Medicine

## 2018-09-30 ENCOUNTER — Encounter: Payer: Self-pay | Admitting: Nurse Practitioner

## 2018-10-03 ENCOUNTER — Ambulatory Visit: Admitting: Nurse Practitioner

## 2018-10-28 ENCOUNTER — Ambulatory Visit: Admitting: Nurse Practitioner

## 2018-10-29 ENCOUNTER — Ambulatory Visit (INDEPENDENT_AMBULATORY_CARE_PROVIDER_SITE_OTHER): Admitting: Nurse Practitioner

## 2018-10-29 ENCOUNTER — Encounter: Payer: Self-pay | Admitting: Nurse Practitioner

## 2018-10-29 ENCOUNTER — Other Ambulatory Visit: Payer: Self-pay

## 2018-10-29 VITALS — BP 112/72 | HR 85 | Temp 98.4°F | Ht 63.8 in | Wt 215.8 lb

## 2018-10-29 DIAGNOSIS — G4700 Insomnia, unspecified: Secondary | ICD-10-CM

## 2018-10-29 DIAGNOSIS — I10 Essential (primary) hypertension: Secondary | ICD-10-CM

## 2018-10-29 MED ORDER — LISINOPRIL-HYDROCHLOROTHIAZIDE 10-12.5 MG PO TABS: 1.0000 | ORAL_TABLET | Freq: Every day | ORAL | 1 refills | Status: DC

## 2018-10-29 MED ORDER — TRAZODONE HCL 50 MG PO TABS: 50.0000 mg | ORAL_TABLET | Freq: Every evening | ORAL | 2 refills | Status: DC | PRN

## 2018-10-29 NOTE — Patient Instructions (Signed)

## 2018-10-29 NOTE — Progress Notes (Signed)
Subjective:     Patient ID: Cindy Cain , female    DOB: 1960/12/26 , 58 y.o.   MRN: [id number]   Chief Complaint  Patient presents with  . med check    HPI  She is taking trazodone 1 tab and tylenol pm.  When she took 1 1/2 tab it made her sick.  She was taking melatonin prior.  She is going to bed at 12am used to go to bed at 2am.  She is not drinking any caffeine.  She awakens at 9pm.    Insomnia The current episode started 1 to 4 weeks ago.     Past Medical History:  Diagnosis Date  . Hypertension      Family History  Problem Relation Age of Onset  . COPD Mother   . Hemolytic uremic syndrome Father   . Heart attack Brother   . Diabetes Brother   . Hypertension Brother      Current Outpatient Medications:  .  acetaminophen (TYLENOL) 325 MG tablet, Take by mouth., Disp: , Rfl:  .  Blood Glucose Monitoring Suppl (FIFTY50 GLUCOSE METER 2.0) w/Device KIT, DX: Z98.84. E16.1 Use as instructed, Disp: , Rfl:  .  CALCIUM PO, Take 1 tablet by mouth daily., Disp: , Rfl:  .  Cholecalciferol (VITAMIN D3) 125 MCG (5000 UT) CAPS, Take by mouth., Disp: , Rfl:  .  docusate sodium (COLACE) 100 MG capsule, Take by mouth., Disp: , Rfl:  .  glucose blood (PRECISION QID TEST) test strip, by Other route two (2) times a day as needed., Disp: , Rfl:  .  lisinopril-hydrochlorothiazide (PRINZIDE,ZESTORETIC) 10-12.5 MG tablet, Take 1 tablet by mouth daily., Disp: 90 tablet, Rfl: 1 .  magnesium oxide (MAG-OX) 400 MG tablet, Take 400 mg by mouth daily., Disp: , Rfl:  .  meclizine (ANTIVERT) 12.5 MG tablet, Take 1 tablet (12.5 mg total) by mouth 3 (three) times daily as needed for dizziness., Disp: 30 tablet, Rfl: 0 .  Multiple Vitamins-Minerals (MULTIVITAMIN WITH MINERALS) tablet, Take 1 tablet by mouth daily., Disp: , Rfl:  .  Thiamine HCl (VITAMIN B-1) 250 MG tablet, Take by mouth., Disp: , Rfl:  .  traZODone (DESYREL) 50 MG tablet, Take 1 tablet (50 mg total) by mouth at bedtime as needed for  sleep., Disp: 30 tablet, Rfl: 2   Allergies  Allergen Reactions  . Aspirin Other (See Comments)    Ulcers   . Codeine Other (See Comments)    ulcers     Review of Systems  Constitutional: Negative.   Respiratory: Negative.   Cardiovascular: Negative.   Musculoskeletal: Negative.   Neurological: Negative for dizziness and headaches.  Psychiatric/Behavioral: The patient has insomnia.      Today's Vitals   10/29/18 1146  BP: 112/72  Pulse: 85  Temp: 98.4 F (36.9 C)  TempSrc: Oral  Weight: 215 lb 12.8 oz (97.9 kg)  Height: 5' 3.8" (1.621 m)  PainSc: 0-No pain   Body mass index is 37.27 kg/m.   Objective:  Physical Exam Constitutional:      Appearance: Normal appearance.  Cardiovascular:     Rate and Rhythm: Normal rate and regular rhythm.     Pulses: Normal pulses.     Heart sounds: Normal heart sounds. No murmur.  Pulmonary:     Effort: Pulmonary effort is normal. No respiratory distress.     Breath sounds: Normal breath sounds.  Skin:    General: Skin is warm and dry.     Capillary  Refill: Capillary refill takes less than 2 seconds.  Neurological:     General: No focal deficit present.     Mental Status: She is alert and oriented to person, place, and time.  Psychiatric:        Mood and Affect: Mood normal.        Behavior: Behavior normal.        Thought Content: Thought content normal.        Judgment: Judgment normal.         Assessment And Plan:     1. Insomnia, unspecified type  The 67m of trazodone makes her groggy so continue with the previous regimen  Also discussed not looking at her cellphone or computer at least 2 hours before bed due to the light may be stimulating her brain - traZODone (DESYREL) 50 MG tablet; Take 1 tablet (50 mg total) by mouth at bedtime as needed for sleep.  Dispense: 30 tablet; Refill: 2  2. Essential hypertension . B/P is controlled.  . CMP ordered to check renal function.  . The importance of regular exercise  and dietary modification was stressed to the patient.  . Stressed importance of losing ten percent of her body weight to help with B/P - BMP8+eGFR - lisinopril-hydrochlorothiazide (ZESTORETIC) 10-12.5 MG tablet; Take 1 tablet by mouth daily.  Dispense: 90 tablet; Refill: 1    JMinette Brine FNP    THE PATIENT IS ENCOURAGED TO PRACTICE SOCIAL DISTANCING DUE TO THE COVID-19 PANDEMIC.

## 2018-10-30 LAB — BMP8+EGFR
BUN/Creatinine Ratio: 13 (ref 9–23)
BUN: 14 mg/dL (ref 6–24)
CO2: 25 mmol/L (ref 20–29)
Calcium: 9.7 mg/dL (ref 8.7–10.2)
Chloride: 100 mmol/L (ref 96–106)
Creatinine, Ser: 1.05 mg/dL — ABNORMAL HIGH (ref 0.57–1.00)
GFR calc Af Amer: 69 mL/min/{1.73_m2} (ref >59)
GFR calc non Af Amer: 60 mL/min/{1.73_m2} (ref >59)
Glucose: 91 mg/dL (ref 65–99)
Potassium: 4.6 mmol/L (ref 3.5–5.2)
Sodium: 139 mmol/L (ref 134–144)

## 2018-12-25 ENCOUNTER — Ambulatory Visit: Admitting: Internal Medicine

## 2019-01-21 ENCOUNTER — Ambulatory Visit: Payer: Self-pay | Admitting: Nurse Practitioner

## 2019-01-23 ENCOUNTER — Other Ambulatory Visit: Payer: Self-pay

## 2019-01-23 MED ORDER — DICLOFENAC SODIUM 1 % TD GEL: 2.0000 g | Freq: Four times a day (QID) | TRANSDERMAL | 1 refills | Status: DC | PRN

## 2019-04-16 ENCOUNTER — Encounter: Payer: Self-pay | Admitting: Nurse Practitioner

## 2019-04-16 ENCOUNTER — Telehealth (INDEPENDENT_AMBULATORY_CARE_PROVIDER_SITE_OTHER): Admitting: Nurse Practitioner

## 2019-04-16 ENCOUNTER — Other Ambulatory Visit: Payer: Self-pay

## 2019-04-16 VITALS — BP 124/93 | HR 111 | Ht 65.0 in | Wt 213.0 lb

## 2019-04-16 DIAGNOSIS — R05 Cough: Secondary | ICD-10-CM

## 2019-04-16 DIAGNOSIS — R432 Parageusia: Secondary | ICD-10-CM

## 2019-04-16 DIAGNOSIS — R0981 Nasal congestion: Secondary | ICD-10-CM | POA: Diagnosis not present

## 2019-04-16 DIAGNOSIS — Z20828 Contact with and (suspected) exposure to other viral communicable diseases: Secondary | ICD-10-CM

## 2019-04-16 DIAGNOSIS — R059 Cough, unspecified: Secondary | ICD-10-CM

## 2019-04-16 DIAGNOSIS — J069 Acute upper respiratory infection, unspecified: Secondary | ICD-10-CM | POA: Diagnosis not present

## 2019-04-16 DIAGNOSIS — R430 Anosmia: Secondary | ICD-10-CM

## 2019-04-16 MED ORDER — AZITHROMYCIN 250 MG PO TABS: ORAL_TABLET | ORAL | 0 refills | Status: AC

## 2019-04-16 MED ORDER — LORATADINE 10 MG PO TABS: 10.0000 mg | ORAL_TABLET | Freq: Every day | ORAL | 2 refills | Status: DC

## 2019-04-16 NOTE — Progress Notes (Signed)
Virtual Visit via Video   This visit type was conducted due to national recommendations for restrictions regarding the COVID-19 Pandemic (e.g. social distancing) in an effort to limit this patient's exposure and mitigate transmission in our community.  Due to her co-morbid illnesses, this patient is at least at moderate risk for complications without adequate follow up.  This format is felt to be most appropriate for this patient at this time.  All issues noted in this document were discussed and addressed.  A limited physical exam was performed with this format.    This visit type was conducted due to national recommendations for restrictions regarding the COVID-19 Pandemic (e.g. social distancing) in an effort to limit this patient's exposure and mitigate transmission in our community.  Patients identity confirmed using two different identifiers.  This format is felt to be most appropriate for this patient at this time.  All issues noted in this document were discussed and addressed.  No physical exam was performed (except for noted visual exam findings with Video Visits).    Date:  04/24/2019   ID:  Cindy Cain, DOB Sep 12, 1960, MRN [id number]  Patient Location:  Home - spoke with Wendall Stade  Provider location:   Office   Chief Complaint:  Cold symptoms  History of Present Illness:    Cindy Cain is a 58 y.o. female who presents via video conferencing for a telehealth visit today.    The patient does have symptoms concerning for COVID-19 infection (fever, chills, cough, or new shortness of breath).   She thinks she got from her husband. Feels like regular cold symptoms just taking longer to get better.    URI  This is a new problem. Episode onset: tuesday of last week - 9 days ago. Maximum temperature: temp up to 99.6. Associated symptoms include congestion, coughing and headaches (none for 2 days). Pertinent negatives include no abdominal pain or sore throat. Associated  symptoms comments: Taste and smell completely gone. She has tried decongestant (dayquil and aleve, cough syrup, benadryl) for the symptoms.     Past Medical History:  Diagnosis Date  . Hypertension    Past Surgical History:  Procedure Laterality Date  . ABDOMINAL HYSTERECTOMY     partial, secondary to bleeding and anemia  . APPENDECTOMY    . CHOLECYSTECTOMY    . GASTRIC BYPASS       Current Meds  Medication Sig  . Blood Glucose Monitoring Suppl (FIFTY50 GLUCOSE METER 2.0) w/Device KIT DX: Z98.84. E16.1 Use as instructed  . CALCIUM PO Take 1 tablet by mouth daily.  . Cholecalciferol (VITAMIN D3) 125 MCG (5000 UT) CAPS Take by mouth.  . diclofenac sodium (VOLTAREN) 1 % GEL Apply 2 g topically 4 (four) times daily as needed.  . docusate sodium (COLACE) 100 MG capsule Take by mouth.  Marland Kitchen glucose blood (PRECISION QID TEST) test strip by Other route two (2) times a day as needed.  Marland Kitchen lisinopril-hydrochlorothiazide (ZESTORETIC) 10-12.5 MG tablet Take 1 tablet by mouth daily.  . magnesium oxide (MAG-OX) 400 MG tablet Take 400 mg by mouth daily.  . meclizine (ANTIVERT) 12.5 MG tablet Take 1 tablet (12.5 mg total) by mouth 3 (three) times daily as needed for dizziness.  . Multiple Vitamins-Minerals (MULTIVITAMIN WITH MINERALS) tablet Take 1 tablet by mouth daily.  . Thiamine HCl (VITAMIN B-1) 250 MG tablet Take by mouth.  . traZODone (DESYREL) 50 MG tablet Take 1 tablet (50 mg total) by mouth at bedtime as needed for  sleep.     Allergies:   Aspirin and Codeine   Social History   Tobacco Use  . Smoking status: Never Smoker  . Smokeless tobacco: Never Used  Substance Use Topics  . Alcohol use: No  . Drug use: No     Family Hx: The patient's family history includes COPD in her mother; Diabetes in her brother; Heart attack in her brother; Hemolytic uremic syndrome in her father; Hypertension in her brother.  ROS:   Please see the history of present illness.    Review of Systems   Constitutional: Positive for malaise/fatigue. Negative for fever (low grade fever).  HENT: Positive for congestion. Negative for sore throat.   Respiratory: Positive for cough. Negative for shortness of breath.   Cardiovascular: Negative.   Gastrointestinal: Negative for abdominal pain.  Neurological: Positive for headaches (none for 2 days). Negative for dizziness.    All other systems reviewed and are negative.   Labs/Other Tests and Data Reviewed:    Recent Labs: 10/29/2018: BUN 14; Creatinine, Ser 1.05; Potassium 4.6; Sodium 139   Recent Lipid Panel Lab Results  Component Value Date/Time   CHOL 186 04/16/2018 02:05 PM   TRIG 61 04/16/2018 02:05 PM   HDL 61 04/16/2018 02:05 PM   CHOLHDL 3.0 04/16/2018 02:05 PM   LDLCALC 113 (H) 04/16/2018 02:05 PM    Wt Readings from Last 3 Encounters:  04/16/19 213 lb (96.6 kg)  10/29/18 215 lb 12.8 oz (97.9 kg)  09/04/18 211 lb 9.6 oz (96 kg)     Exam:    Vital Signs:  BP (!) 124/93 (BP Location: Right Arm, Patient Position: Sitting, Cuff Size: Small)   Pulse (!) 111   Ht _0  (1.651 m)   Wt 213 lb (96.6 kg)   BMI 35.45 kg/m     Physical Exam  Constitutional: She is oriented to person, place, and time and well-developed, well-nourished, and in no distress.  Pulmonary/Chest: Effort normal and breath sounds normal. No respiratory distress.  Neurological: She is alert and oriented to person, place, and time.  Psychiatric: Mood, memory, affect and judgment normal.    ASSESSMENT & PLAN:    1. Cough  She can take over the counter cough syrup as needed - azithromycin (ZITHROMAX) 250 MG tablet; Take 2 tablets (500 mg) on  Day 1,  followed by 1 tablet (250 mg) once daily on Days 2 through 5.  Dispense: 6 each; Refill: 0  2. Viral upper respiratory tract infection  9 day history of cold symptoms and not feeling better, will treat with azithromycin due to the persistent cough and current symptom related to covid 19 - azithromycin  (ZITHROMAX) 250 MG tablet; Take 2 tablets (500 mg) on  Day 1,  followed by 1 tablet (250 mg) once daily on Days 2 through 5.  Dispense: 6 each; Refill: 0  3. Nasal congestion  Use antihistamine daily and nasal spray - loratadine (CLARITIN) 10 MG tablet; Take 1 tablet (10 mg total) by mouth daily.  Dispense: 30 tablet; Refill: 2  4. Loss of taste  Will check coronavirus test  - Novel Coronavirus, NAA ([facility])  5. Loss of smell  Will check coronavirus test  She is advised to remain in quarantine until she has her results - Novel Coronavirus, NAA ([facility])   COVID-19 Education: The signs and symptoms of COVID-19 were discussed with the patient and how to seek care for testing (follow up with PCP or arrange E-visit).  The importance of social  distancing was discussed today.  Patient Risk:   After full review of this patients clinical status, I feel that they are at least moderate risk at this time.  Time:   Today, I have spent 11 minutes/ seconds with the patient with telehealth technology discussing above diagnoses.     Medication Adjustments/Labs and Tests Ordered: Current medicines are reviewed at length with the patient today.  Concerns regarding medicines are outlined above.   Tests Ordered: No orders of the defined types were placed in this encounter.   Medication Changes: Meds ordered this encounter  Medications  . azithromycin (ZITHROMAX) 250 MG tablet    Sig: Take 2 tablets (500 mg) on  Day 1,  followed by 1 tablet (250 mg) once daily on Days 2 through 5.    Dispense:  6 each    Refill:  0  . loratadine (CLARITIN) 10 MG tablet    Sig: Take 1 tablet (10 mg total) by mouth daily.    Dispense:  30 tablet    Refill:  2    Disposition:  Follow up prn  Signed, [name], FNP

## 2019-04-17 ENCOUNTER — Other Ambulatory Visit: Payer: Self-pay

## 2019-04-17 DIAGNOSIS — Z20822 Contact with and (suspected) exposure to covid-19: Secondary | ICD-10-CM

## 2019-04-19 LAB — NOVEL CORONAVIRUS, NAA: SARS-CoV-2, NAA: NOT DETECTED

## 2019-04-22 ENCOUNTER — Encounter: Admitting: Internal Medicine

## 2019-06-19 ENCOUNTER — Telehealth: Payer: Self-pay

## 2019-06-19 NOTE — Telephone Encounter (Signed)
Patient called requesting an appointment she stated she has a cough and some difficulty breathing.  I returned pt call and notified her we don't have any openings today but we do recommend she go to urgent care to be evaluated patient stated she wasn't expecting to be seen today her symptoms are just everyday allergies if she was that bad she would have gone to the ER or urgent care I advised her that she should go get tested for covid because we have had people with everyday allergies come back positive for covid she stated she got tested a while back and just got the covid vaccine. YRL,RMA

## 2019-06-23 ENCOUNTER — Encounter: Payer: Self-pay | Admitting: Nurse Practitioner

## 2019-06-23 ENCOUNTER — Other Ambulatory Visit: Payer: Self-pay

## 2019-06-23 ENCOUNTER — Telehealth (INDEPENDENT_AMBULATORY_CARE_PROVIDER_SITE_OTHER): Admitting: Nurse Practitioner

## 2019-06-23 VITALS — BP 121/83 | HR 58 | Temp 97.9°F | Ht 65.0 in | Wt 215.0 lb

## 2019-06-23 DIAGNOSIS — R053 Chronic cough: Secondary | ICD-10-CM

## 2019-06-23 DIAGNOSIS — R0602 Shortness of breath: Secondary | ICD-10-CM | POA: Diagnosis not present

## 2019-06-23 DIAGNOSIS — R05 Cough: Secondary | ICD-10-CM

## 2019-06-23 HISTORY — DX: Chronic cough: R05.3

## 2019-06-23 MED ORDER — ALBUTEROL SULFATE HFA 108 (90 BASE) MCG/ACT IN AERS: 2.0000 | INHALATION_SPRAY | Freq: Four times a day (QID) | RESPIRATORY_TRACT | 1 refills | Status: DC | PRN

## 2019-06-23 MED ORDER — PREDNISONE 10 MG (21) PO TBPK: ORAL_TABLET | ORAL | 0 refills | Status: DC

## 2019-06-23 NOTE — Progress Notes (Signed)
Virtual Visit via Video   This visit type was conducted due to national recommendations for restrictions regarding the COVID-19 Pandemic (e.g. social distancing) in an effort to limit this patient's exposure and mitigate transmission in our community.  Due to her co-morbid illnesses, this patient is at least at moderate risk for complications without adequate follow up.  This format is felt to be most appropriate for this patient at this time.  All issues noted in this document were discussed and addressed.  A limited physical exam was performed with this format.    This visit type was conducted due to national recommendations for restrictions regarding the COVID-19 Pandemic (e.g. social distancing) in an effort to limit this patient's exposure and mitigate transmission in our community.  Patients identity confirmed using two different identifiers.  This format is felt to be most appropriate for this patient at this time.  All issues noted in this document were discussed and addressed.  No physical exam was performed (except for noted visual exam findings with Video Visits).    Date:  07/01/2019   ID:  Cindy Cain, DOB 1961-01-05, MRN [id number]  Patient Location:  Home - spoke with Wendall Stade  Provider location:   Office    Chief Complaint:  Persistent Cough   History of Present Illness:    Cindy Cain is a 59 y.o. female who presents via video conferencing for a telehealth visit today.    The patient does have symptoms concerning for COVID-19 infection (fever, chills, cough, or new shortness of breath).   HPI  She had been having intermittent shortness of breath. She reports she had been sick in January and had been tested for covid which was negative after quarantining for 10 days.  She had lost her sense of taste and smell. She is having shortness of breath with doing her regular activities. During Christmas she was unable to taste and smell.  Past Medical History:    Diagnosis Date  . Hypertension    Past Surgical History:  Procedure Laterality Date  . ABDOMINAL HYSTERECTOMY     partial, secondary to bleeding and anemia  . APPENDECTOMY    . CHOLECYSTECTOMY    . GASTRIC BYPASS       Current Meds  Medication Sig  . Blood Glucose Monitoring Suppl (FIFTY50 GLUCOSE METER 2.0) w/Device KIT DX: Z98.84. E16.1 Use as instructed  . CALCIUM PO Take 1 tablet by mouth daily.  . Cholecalciferol (VITAMIN D3) 125 MCG (5000 UT) CAPS Take by mouth.  . diclofenac sodium (VOLTAREN) 1 % GEL Apply 2 g topically 4 (four) times daily as needed.  . docusate sodium (COLACE) 100 MG capsule Take by mouth.  Marland Kitchen glucose blood (PRECISION QID TEST) test strip by Other route two (2) times a day as needed.  Marland Kitchen lisinopril-hydrochlorothiazide (ZESTORETIC) 10-12.5 MG tablet Take 1 tablet by mouth daily.  . magnesium oxide (MAG-OX) 400 MG tablet Take 400 mg by mouth daily.  . meclizine (ANTIVERT) 12.5 MG tablet Take 1 tablet (12.5 mg total) by mouth 3 (three) times daily as needed for dizziness.  . Multiple Vitamins-Minerals (MULTIVITAMIN WITH MINERALS) tablet Take 1 tablet by mouth daily.  . Thiamine HCl (VITAMIN B-1) 250 MG tablet Take by mouth.     Allergies:   Aspirin and Codeine   Social History   Tobacco Use  . Smoking status: Never Smoker  . Smokeless tobacco: Never Used  Substance Use Topics  . Alcohol use: No  . Drug use:  No     Family Hx: The patient's family history includes COPD in her mother; Diabetes in her brother; Heart attack in her brother; Hemolytic uremic syndrome in her father; Hypertension in her brother.  ROS:   Please see the history of present illness.    Review of Systems  Constitutional: Negative.   Respiratory: Positive for cough. Negative for shortness of breath.   Cardiovascular: Negative.   Neurological: Negative.   Psychiatric/Behavioral: Negative.     All other systems reviewed and are negative.   Labs/Other Tests and Data  Reviewed:    Recent Labs: 10/29/2018: BUN 14; Creatinine, Ser 1.05; Potassium 4.6; Sodium 139   Recent Lipid Panel Lab Results  Component Value Date/Time   CHOL 186 04/16/2018 02:05 PM   TRIG 61 04/16/2018 02:05 PM   HDL 61 04/16/2018 02:05 PM   CHOLHDL 3.0 04/16/2018 02:05 PM   LDLCALC 113 (H) 04/16/2018 02:05 PM    Wt Readings from Last 3 Encounters:  06/23/19 215 lb (97.5 kg)  04/16/19 213 lb (96.6 kg)  10/29/18 215 lb 12.8 oz (97.9 kg)     Exam:    Vital Signs:  BP 121/83 (BP Location: Right Arm, Patient Position: Sitting, Cuff Size: Large)   Pulse (!) 58   Temp 97.9 F (36.6 C) (Oral)   Ht _0  (1.651 m)   Wt 215 lb (97.5 kg)   BMI 35.78 kg/m     Physical Exam  Constitutional: She is oriented to person, place, and time and well-developed, well-nourished, and in no distress. No distress.  Pulmonary/Chest: Effort normal. No respiratory distress.  Cough noted on physical exam sounds constricted  Neurological: She is alert and oriented to person, place, and time.  Psychiatric: Mood, memory, affect and judgment normal.    ASSESSMENT & PLAN:    1. Persistent cough  Has been ongoing since December  Will send for CXR to evaluate for pneumonia  Will also treat with steroid taper and albuterol inhaler as needed - DG Chest 2 View; Future - predniSONE (STERAPRED UNI-PAK 21 TAB) 10 MG (21) TBPK tablet; Take as directed  Dispense: 21 tablet; Refill: 0 - albuterol (VENTOLIN HFA) 108 (90 Base) MCG/ACT inhaler; Inhale 2 puffs into the lungs every 6 (six) hours as needed for wheezing or shortness of breath.  Dispense: 18 g; Refill: 1  2. Shortness of breath  May be related to possible bronchitis   She had a negative covid test in December   COVID-19 Education: The signs and symptoms of COVID-19 were discussed with the patient and how to seek care for testing (follow up with PCP or arrange E-visit).  The importance of social distancing was discussed today.  Patient  Risk:   After full review of this patients clinical status, I feel that they are at least moderate risk at this time.  Time:   Today, I have spent 12 minutes/ seconds with the patient with telehealth technology discussing above diagnoses.     Medication Adjustments/Labs and Tests Ordered: Current medicines are reviewed at length with the patient today.  Concerns regarding medicines are outlined above.   Tests Ordered: Orders Placed This Encounter  Procedures  . DG Chest 2 View    Medication Changes: Meds ordered this encounter  Medications  . predniSONE (STERAPRED UNI-PAK 21 TAB) 10 MG (21) TBPK tablet    Sig: Take as directed    Dispense:  21 tablet    Refill:  0  . albuterol (VENTOLIN HFA) 108 (90 Base)  MCG/ACT inhaler    Sig: Inhale 2 puffs into the lungs every 6 (six) hours as needed for wheezing or shortness of breath.    Dispense:  18 g    Refill:  1    Disposition:  Follow up prn  Signed, [name], FNP

## 2019-06-24 ENCOUNTER — Ambulatory Visit
Admission: RE | Admit: 2019-06-24 | Discharge: 2019-06-24 | Disposition: A | Discharge status: Home / Self Care | Source: Ambulatory Visit | Attending: Nurse Practitioner | Admitting: Nurse Practitioner

## 2019-06-24 DIAGNOSIS — R05 Cough: Secondary | ICD-10-CM

## 2019-06-24 DIAGNOSIS — R053 Chronic cough: Secondary | ICD-10-CM

## 2019-07-09 ENCOUNTER — Other Ambulatory Visit: Payer: Self-pay

## 2019-07-09 ENCOUNTER — Encounter: Payer: Self-pay | Admitting: Nurse Practitioner

## 2019-07-09 ENCOUNTER — Ambulatory Visit (INDEPENDENT_AMBULATORY_CARE_PROVIDER_SITE_OTHER): Admitting: Nurse Practitioner

## 2019-07-09 VITALS — BP 122/70 | HR 74 | Temp 98.2°F | Ht 65.0 in | Wt 220.2 lb

## 2019-07-09 DIAGNOSIS — R053 Chronic cough: Secondary | ICD-10-CM

## 2019-07-09 DIAGNOSIS — R05 Cough: Secondary | ICD-10-CM | POA: Diagnosis not present

## 2019-07-09 DIAGNOSIS — I10 Essential (primary) hypertension: Secondary | ICD-10-CM | POA: Diagnosis not present

## 2019-07-09 MED ORDER — FLUTICASONE-SALMETEROL 100-50 MCG/DOSE IN AEPB: 1.0000 | INHALATION_SPRAY | Freq: Two times a day (BID) | RESPIRATORY_TRACT | 1 refills | Status: DC

## 2019-07-09 NOTE — Progress Notes (Signed)
This visit occurred during the SARS-CoV-2 public health emergency.  Safety protocols were in place, including screening questions prior to the visit, additional usage of staff PPE, and extensive cleaning of exam room while observing appropriate contact time as indicated for disinfecting solutions.  Subjective:     Patient ID: Cindy Cain , female    DOB: 03/17/1961 , 59 y.o.   MRN: [id number]   Chief Complaint  Patient presents with  . Cough    HPI  Cough is bad at night, has been taking a decongestant. She feels her cough is 85-90% better. She is pausing every 30 minutes before was every 10 minutes. She is able to walk up the stairs.    Cough This is a recurrent problem. The current episode started more than 1 month ago. The problem has been gradually improving. The cough is non-productive. Pertinent negatives include no chest pain or headaches.     Past Medical History:  Diagnosis Date  . Hypertension      Family History  Problem Relation Age of Onset  . COPD Mother   . Hemolytic uremic syndrome Father   . Heart attack Brother   . Diabetes Brother   . Hypertension Brother      Current Outpatient Medications:  .  acetaminophen (TYLENOL) 325 MG tablet, Take by mouth., Disp: , Rfl:  .  albuterol (VENTOLIN HFA) 108 (90 Base) MCG/ACT inhaler, Inhale 2 puffs into the lungs every 6 (six) hours as needed for wheezing or shortness of breath., Disp: 18 g, Rfl: 1 .  Blood Glucose Monitoring Suppl (FIFTY50 GLUCOSE METER 2.0) w/Device KIT, DX: Z98.84. E16.1 Use as instructed, Disp: , Rfl:  .  CALCIUM PO, Take 1 tablet by mouth daily., Disp: , Rfl:  .  Cholecalciferol (VITAMIN D3) 125 MCG (5000 UT) CAPS, Take by mouth., Disp: , Rfl:  .  diclofenac sodium (VOLTAREN) 1 % GEL, Apply 2 g topically 4 (four) times daily as needed., Disp: 100 g, Rfl: 1 .  docusate sodium (COLACE) 100 MG capsule, Take by mouth., Disp: , Rfl:  .  glucose blood (PRECISION QID TEST) test strip, by Other route  two (2) times a day as needed., Disp: , Rfl:  .  lisinopril-hydrochlorothiazide (ZESTORETIC) 10-12.5 MG tablet, Take 1 tablet by mouth daily., Disp: 90 tablet, Rfl: 1 .  magnesium oxide (MAG-OX) 400 MG tablet, Take 400 mg by mouth daily., Disp: , Rfl:  .  meclizine (ANTIVERT) 12.5 MG tablet, Take 1 tablet (12.5 mg total) by mouth 3 (three) times daily as needed for dizziness., Disp: 30 tablet, Rfl: 0 .  Multiple Vitamins-Minerals (MULTIVITAMIN WITH MINERALS) tablet, Take 1 tablet by mouth daily., Disp: , Rfl:  .  predniSONE (STERAPRED UNI-PAK 21 TAB) 10 MG (21) TBPK tablet, Take as directed, Disp: 21 tablet, Rfl: 0 .  Thiamine HCl (VITAMIN B-1) 250 MG tablet, Take by mouth., Disp: , Rfl:  .  loratadine (CLARITIN) 10 MG tablet, Take 1 tablet (10 mg total) by mouth daily. (Patient not taking: Reported on 07/09/2019), Disp: 30 tablet, Rfl: 2 .  traZODone (DESYREL) 50 MG tablet, Take 1 tablet (50 mg total) by mouth at bedtime as needed for sleep. (Patient not taking: Reported on 07/09/2019), Disp: 30 tablet, Rfl: 2   Allergies  Allergen Reactions  . Aspirin Other (See Comments)    Ulcers   . Codeine Other (See Comments)    ulcers     Review of Systems  Constitutional: Negative for fatigue.  Respiratory: Positive for  cough.   Cardiovascular: Negative for chest pain.  Neurological: Negative for dizziness and headaches.  Psychiatric/Behavioral: Negative.      Today's Vitals   07/09/19 1541  BP: 122/70  Pulse: 74  Temp: 98.2 F (36.8 C)  TempSrc: Oral  Weight: 220 lb 3.2 oz (99.9 kg)  Height: 5' 5"  (1.651 m)   Body mass index is 36.64 kg/m.   Objective:  Physical Exam Constitutional:      General: She is not in acute distress.    Appearance: Normal appearance.  Cardiovascular:     Rate and Rhythm: Normal rate and regular rhythm.     Pulses: Normal pulses.     Heart sounds: Normal heart sounds. No murmur.  Pulmonary:     Effort: Pulmonary effort is normal. No respiratory distress.      Breath sounds: Normal breath sounds.  Skin:    Capillary Refill: Capillary refill takes less than 2 seconds.  Neurological:     General: No focal deficit present.     Mental Status: She is alert and oriented to person, place, and time.  Psychiatric:        Mood and Affect: Mood normal.        Behavior: Behavior normal.        Thought Content: Thought content normal.        Judgment: Judgment normal.         Assessment And Plan:     1. Persistent cough  She continues to have a cough since November  Will treat her with advair to see if this is related to a chronic bronchitis - Fluticasone-Salmeterol (ADVAIR DISKUS) 100-50 MCG/DOSE AEPB; Inhale 1 puff into the lungs 2 (two) times daily.  Dispense: 1 each; Refill: 1  2. Essential hypertension  Chronic, good control  Will check kidney functions - BMP8+eGFR   [name], FNP    THE PATIENT IS ENCOURAGED TO PRACTICE SOCIAL DISTANCING DUE TO THE COVID-19 PANDEMIC.

## 2019-07-09 NOTE — Patient Instructions (Signed)
Use Advair 1 puff two times a day for the next month if not better return call to office you may need a referral to the lung specialist.

## 2019-07-10 LAB — BMP8+EGFR
BUN/Creatinine Ratio: 8 — ABNORMAL LOW (ref 9–23)
BUN: 8 mg/dL (ref 6–24)
CO2: 23 mmol/L (ref 20–29)
Calcium: 9.9 mg/dL (ref 8.7–10.2)
Chloride: 101 mmol/L (ref 96–106)
Creatinine, Ser: 0.95 mg/dL (ref 0.57–1.00)
GFR calc Af Amer: 77 mL/min/{1.73_m2} (ref >59)
GFR calc non Af Amer: 67 mL/min/{1.73_m2} (ref >59)
Glucose: 85 mg/dL (ref 65–99)
Potassium: 4.2 mmol/L (ref 3.5–5.2)
Sodium: 139 mmol/L (ref 134–144)

## 2019-07-25 ENCOUNTER — Encounter: Payer: Self-pay | Admitting: Nurse Practitioner

## 2019-07-28 ENCOUNTER — Other Ambulatory Visit: Payer: Self-pay | Admitting: Nurse Practitioner

## 2019-07-28 DIAGNOSIS — R05 Cough: Secondary | ICD-10-CM

## 2019-07-28 DIAGNOSIS — R053 Chronic cough: Secondary | ICD-10-CM

## 2019-07-31 ENCOUNTER — Other Ambulatory Visit: Payer: Self-pay | Admitting: Nurse Practitioner

## 2019-07-31 DIAGNOSIS — R053 Chronic cough: Secondary | ICD-10-CM

## 2019-07-31 DIAGNOSIS — R05 Cough: Secondary | ICD-10-CM

## 2019-08-21 ENCOUNTER — Encounter: Payer: Self-pay | Admitting: Internal Medicine

## 2019-08-21 ENCOUNTER — Other Ambulatory Visit: Payer: Self-pay

## 2019-08-21 ENCOUNTER — Ambulatory Visit (INDEPENDENT_AMBULATORY_CARE_PROVIDER_SITE_OTHER): Admitting: Internal Medicine

## 2019-08-21 VITALS — BP 120/80 | HR 63 | Temp 97.2°F | Ht 65.0 in | Wt 207.0 lb

## 2019-08-21 DIAGNOSIS — U071 COVID-19: Secondary | ICD-10-CM | POA: Diagnosis not present

## 2019-08-21 DIAGNOSIS — R05 Cough: Secondary | ICD-10-CM

## 2019-08-21 DIAGNOSIS — R053 Chronic cough: Secondary | ICD-10-CM

## 2019-08-21 DIAGNOSIS — R0602 Shortness of breath: Secondary | ICD-10-CM | POA: Diagnosis not present

## 2019-08-21 NOTE — Patient Instructions (Addendum)
You can take cetirizine an over the counter anti-histamine to help at night time with drainage and cough.

## 2019-08-21 NOTE — Progress Notes (Signed)
DEBBORA Cain    [id number]    04/17/1961  Primary Care Physician:Sanders, Bailey Mech, MD  Referring Physician: [name], [place],  [place] [address] Reason for Consultation: "persistent cough" Date of Consultation: 08/21/2019  Chief complaint:   Chief Complaint  Patient presents with  . Consult    persistant cough.  improving.  SOB still happening, it is back.  Has to stop while working out and doing steps.     HPI: Here for shortness of breath.  She had flu like symptoms, anosmia and had a negative covid test in Dec 2020.   Shortness of breath improving cough is improving now. She was given an advair and  albuterol inhaler which gave her diarrhea and didn't seem to help much. Symptoms feeling better after she did a 16 day fast.   Has dyspnea with going up the stairs. Used to be able to exercise 60 minutes, now only 30 minutes. Overall feels she is improving slowly but she felt that she should keep the appointment.  Cough is dry and most bothersome at night time, she sleeps with water at her bed. Frequent throat clearing and post nasal drainage.   No prior respiratory conditions.   Social history:  Occupation: Corporate treasurer, works from home.  Exposures: Lives at home at home with husband, no pets.  Smoking history: never smoker, passive smoke exposure in childhood  Social History   Occupational History  . Not on file  Tobacco Use  . Smoking status: Never Smoker  . Smokeless tobacco: Never Used  Substance and Sexual Activity  . Alcohol use: No  . Drug use: No  . Sexual activity: Not on file    Relevant family history:  Family History  Problem Relation Age of Onset  . COPD Mother   . Hemolytic uremic syndrome Father   . Heart attack Brother   . Diabetes Brother   . Hypertension Brother   . Allergies Son     Past Medical History:  Diagnosis Date  . Hypertension     Past Surgical History:  Procedure Laterality  Date  . ABDOMINAL HYSTERECTOMY     partial, secondary to bleeding and anemia  . APPENDECTOMY    . CHOLECYSTECTOMY    . GASTRIC BYPASS      Review of systems: Review of Systems  Constitutional: Negative for chills, fever and weight loss.  HENT: Negative for congestion, sinus pain and sore throat.   Eyes: Negative for discharge and redness.  Respiratory: Positive for cough and shortness of breath. Negative for hemoptysis, sputum production and wheezing.   Cardiovascular: Negative for chest pain, palpitations and leg swelling.  Gastrointestinal: Negative for heartburn, nausea and vomiting.  Musculoskeletal: Negative for joint pain and myalgias.  Skin: Negative for rash.  Neurological: Positive for headaches. Negative for dizziness, tremors and focal weakness.  Endo/Heme/Allergies: Negative for environmental allergies.  Psychiatric/Behavioral: Negative for depression. The patient is not nervous/anxious.   All other systems reviewed and are negative.   Physical Exam: Blood pressure 120/80, pulse 63, temperature (!) 97.2 F (36.2 C), temperature source Temporal, height 5\' 5"  (1.651 m), weight 207 lb (93.9 kg), SpO2 99 %. Gen:      No acute distress ENT:  no nasal polyps, mucus membranes moist Lungs:    No increased respiratory effort, symmetric chest wall excursion, clear to auscultation bilaterally, no wheezes or crackles CV:         Regular rate  and rhythm; no murmurs, rubs, or gallops.  No pedal edema Abd:      + bowel sounds; soft, non-tender; no distension MSK: no acute synovitis of DIP or PIP joints, no mechanics hands.  Skin:      Warm and dry; no rashes Neuro: normal speech, no focal facial asymmetry Psych: alert and oriented x3, normal mood and affect   Data Reviewed/Medical Decision Making:  Independent interpretation of tests: Imaging: . Review of patient's chest xray images in Feb 2021 revealed no acute cardiopulmonary process. The patient's images have been  independently reviewed by me.    PFTs:  Labs: negative covid test.  Lab Results  Component Value Date   NA 139 07/09/2019   K 4.2 07/09/2019   CL 101 07/09/2019   CO2 23 07/09/2019   Lab Results  Component Value Date   WBC 5.5 04/16/2018   HGB 11.3 04/16/2018   HCT 35.8 04/16/2018   MCV 79 04/16/2018   PLT 377 04/16/2018     Immunization status:  Immunization History  Administered Date(s) Administered  . Moderna SARS-COVID-2 Vaccination 07/25/2019, 08/08/2019    . I reviewed prior external note(s) from Conception Oms, FNP . I reviewed the result(s) of the labs and imaging as noted above.   Assessment:  Chronic Cough Shortness of breath Covid 19 infection  Plan/Recommendations: Overall Ms. Cindy Cain feels that her symptoms are improving.  She has improved substantially since December.  We discussed that if her symptoms do not continue to improve we can see her back as needed and pursue more testing of her pulmonary function.  She has significant postnasal drainage and frequent throat clearing from rhinitis.  I recommended nocturnal antihistamine to help with sleep and with drainage.  Intranasal steroids could also be helpful in the future if this is persistent.  We discussed disease management and progression at length today.    Return to Care: Return if symptoms worsen or fail to improve, for cough.  Durel Salts, MD Pulmonary and Critical Care Medicine [facility] Office:[phone]  CC: Arnette Felts, FNP

## 2019-08-27 ENCOUNTER — Other Ambulatory Visit: Payer: Self-pay | Admitting: Nurse Practitioner

## 2019-08-27 DIAGNOSIS — I10 Essential (primary) hypertension: Secondary | ICD-10-CM

## 2019-09-09 ENCOUNTER — Encounter: Admitting: Nurse Practitioner

## 2019-12-09 ENCOUNTER — Encounter: Admitting: Nurse Practitioner

## 2020-01-15 ENCOUNTER — Emergency Department (HOSPITAL_COMMUNITY)
Admission: EM | Admit: 2020-01-15 | Discharge: 2020-01-16 | Disposition: A | Discharge status: Home / Self Care | Attending: Emergency Medicine | Admitting: Emergency Medicine

## 2020-01-15 ENCOUNTER — Encounter (HOSPITAL_COMMUNITY): Payer: Self-pay | Admitting: Emergency Medicine

## 2020-01-15 ENCOUNTER — Other Ambulatory Visit: Payer: Self-pay

## 2020-01-15 ENCOUNTER — Emergency Department (HOSPITAL_COMMUNITY)

## 2020-01-15 DIAGNOSIS — R42 Dizziness and giddiness: Secondary | ICD-10-CM | POA: Insufficient documentation

## 2020-01-15 DIAGNOSIS — Z79899 Other long term (current) drug therapy: Secondary | ICD-10-CM | POA: Insufficient documentation

## 2020-01-15 DIAGNOSIS — I10 Essential (primary) hypertension: Secondary | ICD-10-CM | POA: Diagnosis not present

## 2020-01-15 LAB — DIFFERENTIAL
Abs Immature Granulocytes: 0.03 10*3/uL (ref 0.00–0.07)
Basophils Absolute: 0.1 10*3/uL (ref 0.0–0.1)
Basophils Relative: 1 %
Eosinophils Absolute: 0.1 10*3/uL (ref 0.0–0.5)
Eosinophils Relative: 1 %
Immature Granulocytes: 0 %
Lymphocytes Relative: 30 %
Lymphs Abs: 2.3 10*3/uL (ref 0.7–4.0)
Monocytes Absolute: 0.4 10*3/uL (ref 0.1–1.0)
Monocytes Relative: 5 %
Neutro Abs: 4.9 10*3/uL (ref 1.7–7.7)
Neutrophils Relative %: 63 %

## 2020-01-15 LAB — COMPREHENSIVE METABOLIC PANEL
ALT: 13 U/L (ref 0–44)
AST: 27 U/L (ref 15–41)
Albumin: 3.9 g/dL (ref 3.5–5.0)
Alkaline Phosphatase: 69 U/L (ref 38–126)
Anion gap: 18 — ABNORMAL HIGH (ref 5–15)
BUN: 9 mg/dL (ref 6–20)
CO2: 16 mmol/L — ABNORMAL LOW (ref 22–32)
Calcium: 9 mg/dL (ref 8.9–10.3)
Chloride: 96 mmol/L — ABNORMAL LOW (ref 98–111)
Creatinine, Ser: 1.05 mg/dL — ABNORMAL HIGH (ref 0.44–1.00)
GFR calc Af Amer: >60 mL/min (ref >60)
GFR calc non Af Amer: 59 mL/min — ABNORMAL LOW (ref >60)
Glucose, Bld: 188 mg/dL — ABNORMAL HIGH (ref 70–99)
Potassium: 3.2 mmol/L — ABNORMAL LOW (ref 3.5–5.1)
Sodium: 130 mmol/L — ABNORMAL LOW (ref 135–145)
Total Bilirubin: 0.3 mg/dL (ref 0.3–1.2)
Total Protein: 7.4 g/dL (ref 6.5–8.1)

## 2020-01-15 LAB — I-STAT CHEM 8, ED
BUN: 9 mg/dL (ref 6–20)
Calcium, Ion: 1 mmol/L — ABNORMAL LOW (ref 1.15–1.40)
Chloride: 97 mmol/L — ABNORMAL LOW (ref 98–111)
Creatinine, Ser: 0.9 mg/dL (ref 0.44–1.00)
Glucose, Bld: 188 mg/dL — ABNORMAL HIGH (ref 70–99)
HCT: 33 % — ABNORMAL LOW (ref 36.0–46.0)
Hemoglobin: 11.2 g/dL — ABNORMAL LOW (ref 12.0–15.0)
Potassium: 3.2 mmol/L — ABNORMAL LOW (ref 3.5–5.1)
Sodium: 131 mmol/L — ABNORMAL LOW (ref 135–145)
TCO2: 16 mmol/L — ABNORMAL LOW (ref 22–32)

## 2020-01-15 LAB — CBC
HCT: 31.2 % — ABNORMAL LOW (ref 36.0–46.0)
Hemoglobin: 9.1 g/dL — ABNORMAL LOW (ref 12.0–15.0)
MCH: 20.8 pg — ABNORMAL LOW (ref 26.0–34.0)
MCHC: 29.2 g/dL — ABNORMAL LOW (ref 30.0–36.0)
MCV: 71.4 fL — ABNORMAL LOW (ref 80.0–100.0)
Platelets: 417 10*3/uL — ABNORMAL HIGH (ref 150–400)
RBC: 4.37 MIL/uL (ref 3.87–5.11)
RDW: 17.1 % — ABNORMAL HIGH (ref 11.5–15.5)
WBC: 7.7 10*3/uL (ref 4.0–10.5)
nRBC: 0 % (ref 0.0–0.2)

## 2020-01-15 LAB — PROTIME-INR
INR: 1 (ref 0.8–1.2)
Prothrombin Time: 12.5 seconds (ref 11.4–15.2)

## 2020-01-15 LAB — APTT: aPTT: 28 seconds (ref 24–36)

## 2020-01-15 MED ORDER — SODIUM CHLORIDE 0.9% FLUSH
3.0000 mL | Freq: Once | INTRAVENOUS | Status: AC
  Administered 2020-01-16: 3 mL via INTRAVENOUS

## 2020-01-15 NOTE — ED Triage Notes (Signed)
Pt BIB [facility] from home, c/o sudden onset dizziness and weakness. Hx vertigo, reports this feels similar. Pt took 2 meclizine with little relief. A&O x 4, no neuro deficits noted. EMS VSS.

## 2020-01-16 LAB — I-STAT VENOUS BLOOD GAS, ED
Acid-Base Excess: 4 mmol/L — ABNORMAL HIGH (ref 0.0–2.0)
Bicarbonate: 29.7 mmol/L — ABNORMAL HIGH (ref 20.0–28.0)
Calcium, Ion: 1.12 mmol/L — ABNORMAL LOW (ref 1.15–1.40)
HCT: 32 % — ABNORMAL LOW (ref 36.0–46.0)
Hemoglobin: 10.9 g/dL — ABNORMAL LOW (ref 12.0–15.0)
O2 Saturation: 99 %
Potassium: 4.2 mmol/L (ref 3.5–5.1)
Sodium: 135 mmol/L (ref 135–145)
TCO2: 31 mmol/L (ref 22–32)
pCO2, Ven: 47.4 mmHg (ref 44.0–60.0)
pH, Ven: 7.406 (ref 7.250–7.430)
pO2, Ven: 149 mmHg — ABNORMAL HIGH (ref 32.0–45.0)

## 2020-01-16 LAB — I-STAT CHEM 8, ED
BUN: 9 mg/dL (ref 6–20)
Calcium, Ion: 1.14 mmol/L — ABNORMAL LOW (ref 1.15–1.40)
Chloride: 98 mmol/L (ref 98–111)
Creatinine, Ser: 0.9 mg/dL (ref 0.44–1.00)
Glucose, Bld: 85 mg/dL (ref 70–99)
HCT: 32 % — ABNORMAL LOW (ref 36.0–46.0)
Hemoglobin: 10.9 g/dL — ABNORMAL LOW (ref 12.0–15.0)
Potassium: 4.2 mmol/L (ref 3.5–5.1)
Sodium: 134 mmol/L — ABNORMAL LOW (ref 135–145)
TCO2: 27 mmol/L (ref 22–32)

## 2020-01-16 MED ORDER — MECLIZINE HCL 12.5 MG PO TABS: 12.5000 mg | ORAL_TABLET | Freq: Three times a day (TID) | ORAL | 0 refills | Status: DC | PRN

## 2020-01-16 MED ORDER — POTASSIUM CHLORIDE CRYS ER 20 MEQ PO TBCR
40.0000 meq | EXTENDED_RELEASE_TABLET | Freq: Once | ORAL | Status: AC
  Administered 2020-01-16: 40 meq via ORAL
  Filled 2020-01-16: qty 2

## 2020-01-16 MED ORDER — METOCLOPRAMIDE HCL 5 MG/ML IJ SOLN
10.0000 mg | INTRAMUSCULAR | Status: AC
  Administered 2020-01-16: 10 mg via INTRAVENOUS
  Filled 2020-01-16: qty 2

## 2020-01-16 NOTE — Discharge Instructions (Addendum)
Get help right away if: °You vomit or have diarrhea and are unable to eat or drink anything. °You have problems talking, walking, swallowing, or using your arms, hands, or legs. °You feel generally weak. °You are not thinking clearly or you have trouble forming sentences. It may take a friend or family member to notice this. °You have chest pain, abdominal pain, shortness of breath, or sweating. °Your vision changes. °You have any bleeding. °You have a severe headache. °You have neck pain or a stiff neck. °You have a fever. °

## 2020-01-16 NOTE — ED Provider Notes (Signed)
[facility] EMERGENCY DEPARTMENT Provider Note   CSN: [id number] Arrival date & time: 01/15/20  2000     History Chief Complaint  Patient presents with   Dizziness    Cindy Cain is a 59 y.o. female.  59 year old female with a history of hypertension and peripheral vertigo presents to the emergency department for evaluation of dizziness which began approximately 12 hours ago.  She took 2 tablets of meclizine for symptoms without relief.  Notes associated generalized weakness as well as nausea and vomiting x2.  Vertigo aggravated by position change.  She denies associated headache, head trauma, tinnitus, vision loss, extremity numbness or paresthesias, fever, sinus congestion or recent URI.  Is not chronically anticoagulated.  Reports symptoms have improved since arrival and waiting 9+ hours in the waiting room.  The history is provided by the patient. No language interpreter was used.  Dizziness      Past Medical History:  Diagnosis Date   Hypertension     Patient Active Problem List   Diagnosis Date Noted   Persistent cough 06/23/2019   Shortness of breath 06/23/2019   Insomnia 10/29/2018   Essential hypertension 01/23/2018   Other abnormal glucose 01/23/2018   Knee pain 01/23/2018   Body mass index (BMI) 35.0-35.9, adult 01/23/2018   Peripheral tear of medial meniscus of left knee as current injury 01/24/2016   Status post bariatric surgery 03/14/2012   Hyperlipidemia 11/02/2011    Past Surgical History:  Procedure Laterality Date   ABDOMINAL HYSTERECTOMY     partial, secondary to bleeding and anemia   APPENDECTOMY     CHOLECYSTECTOMY     GASTRIC BYPASS       OB History   No obstetric history on file.     Family History  Problem Relation Age of Onset   COPD Mother    Hemolytic uremic syndrome Father    Heart attack Brother    Diabetes Brother    Hypertension Brother    Allergies Son     Social History    Tobacco Use   Smoking status: Never Smoker   Smokeless tobacco: Never Used  Scientific laboratory technician Use: Never used  Substance Use Topics   Alcohol use: No   Drug use: No    Home Medications Prior to Admission medications   Medication Sig Start Date End Date Taking? Authorizing Provider  acetaminophen (TYLENOL) 325 MG tablet Take by mouth.    [provider]  albuterol (VENTOLIN HFA) 108 (90 Base) MCG/ACT inhaler Inhale 2 puffs into the lungs every 6 (six) hours as needed for wheezing or shortness of breath. 06/23/19   [name], FNP  Blood Glucose Monitoring Suppl (FIFTY50 GLUCOSE METER 2.0) w/Device KIT DX: Z98.84. E16.1 Use as instructed 09/16/15   [provider]  CALCIUM PO Take 1 tablet by mouth daily.    [provider]  Cholecalciferol (VITAMIN D3) 125 MCG (5000 UT) CAPS Take by mouth.    [provider]  diclofenac sodium (VOLTAREN) 1 % GEL Apply 2 g topically 4 (four) times daily as needed. 01/23/19   [name], FNP  docusate sodium (COLACE) 100 MG capsule Take by mouth.    [provider]  glucose blood (PRECISION QID TEST) test strip by Other route two (2) times a day as needed. 09/16/15   [provider]  lisinopril-hydrochlorothiazide (ZESTORETIC) 10-12.5 MG tablet TAKE 1 TABLET BY MOUTH DAILY 08/27/19   [name], FNP  loratadine (CLARITIN) 10 MG tablet  Take 1 tablet (10 mg total) by mouth daily. 04/16/19 04/15/20  [name], FNP  magnesium oxide (MAG-OX) 400 MG tablet Take 400 mg by mouth daily.    [provider]  meclizine (ANTIVERT) 12.5 MG tablet Take 1 tablet (12.5 mg total) by mouth 3 (three) times daily as needed for dizziness. 01/16/20   Antonietta Breach, PA-C  Multiple Vitamins-Minerals (MULTIVITAMIN WITH MINERALS) tablet Take 1 tablet by mouth daily.    [provider]  Thiamine HCl (VITAMIN B-1) 250 MG tablet Take by mouth.    [provider]  traZODone (DESYREL) 50 MG  tablet Take 1 tablet (50 mg total) by mouth at bedtime as needed for sleep. 10/29/18   [name], FNP    Allergies    Aspirin and Codeine  Review of Systems   Review of Systems  Neurological: Positive for dizziness.  Ten systems reviewed and are negative for acute change, except as noted in the HPI.    Physical Exam Updated Vital Signs BP 126/67 (BP Location: Left Arm)    Pulse 85    Temp 97.8 F (36.6 C) (Oral)    Resp 20    SpO2 100%   Physical Exam Vitals and nursing note reviewed.  Constitutional:      General: She is not in acute distress.    Appearance: She is well-developed. She is not diaphoretic.     Comments: Nontoxic appearing and in NAD  HENT:     Head: Normocephalic and atraumatic.  Eyes:     General: No scleral icterus.    Extraocular Movements: Extraocular movements intact.     Conjunctiva/sclera: Conjunctivae normal.     Comments: No appreciable nystagmus  Neck:     Comments: No meningismus Cardiovascular:     Rate and Rhythm: Normal rate and regular rhythm.     Pulses: Normal pulses.  Pulmonary:     Effort: Pulmonary effort is normal. No respiratory distress.     Comments: Respirations even and unlabored Musculoskeletal:        General: Normal range of motion.     Cervical back: Normal range of motion.  Skin:    General: Skin is warm and dry.     Coloration: Skin is not pale.     Findings: No erythema or rash.  Neurological:     General: No focal deficit present.     Mental Status: She is alert and oriented to person, place, and time.     Coordination: Coordination normal.     Comments: GCS 15. Speech is goal oriented. No cranial nerve deficits appreciated; symmetric eyebrow raise, no facial drooping, tongue midline. Patient moves extremities without ataxia. Sensation to light touch intact. Patient ambulatory with steady gait.  Psychiatric:        Behavior: Behavior normal.     ED Results / Procedures / Treatments   Labs (all labs ordered  are listed, but only abnormal results are displayed) Labs Reviewed  CBC - Abnormal; Notable for the following components:      Result Value   Hemoglobin 9.1 (*)    HCT 31.2 (*)    MCV 71.4 (*)    MCH 20.8 (*)    MCHC 29.2 (*)    RDW 17.1 (*)    Platelets 417 (*)    All other components within normal limits  COMPREHENSIVE METABOLIC PANEL - Abnormal; Notable for the following components:   Sodium 130 (*)    Potassium 3.2 (*)    Chloride 96 (*)  CO2 16 (*)    Glucose, Bld 188 (*)    Creatinine, Ser 1.05 (*)    GFR calc non Af Amer 59 (*)    Anion gap 18 (*)    All other components within normal limits  I-STAT CHEM 8, ED - Abnormal; Notable for the following components:   Sodium 131 (*)    Potassium 3.2 (*)    Chloride 97 (*)    Glucose, Bld 188 (*)    Calcium, Ion 1.00 (*)    TCO2 16 (*)    Hemoglobin 11.2 (*)    HCT 33.0 (*)    All other components within normal limits  PROTIME-INR  APTT  DIFFERENTIAL  I-STAT CHEM 8, ED  I-STAT VENOUS BLOOD GAS, ED    EKG None  Radiology CT HEAD WO CONTRAST  Result Date: 01/15/2020 CLINICAL DATA:  Dizziness, weakness EXAM: CT HEAD WITHOUT CONTRAST TECHNIQUE: Contiguous axial images were obtained from the base of the skull through the vertex without intravenous contrast. COMPARISON:  None. FINDINGS: Brain: No hemorrhage, hydrocephalus, acute infarction. 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 Vascular: No hyperdense vessel or unexpected calcification. Skull: No acute calvarial abnormality. Sinuses/Orbits: Visualized paranasal sinuses and mastoids clear. Orbital soft tissues unremarkable. Other: None IMPRESSION: No acute intracranial abnormality. Electronically Signed   By: Rolm Baptise M.D.   On: 01/15/2020 20:48    Procedures Procedures (including critical care time)  Medications Ordered in ED Medications  sodium chloride flush (NS) 0.9 % injection 3 mL (has no administration in  time range)  metoCLOPramide (REGLAN) injection 10 mg (has no administration in time range)  potassium chloride SA (KLOR-CON) CR tablet 40 mEq (has no administration in time range)    ED Course  I have reviewed the triage vital signs and the nursing notes.  Pertinent labs & imaging results that were available during my care of the patient were reviewed by me and considered in my medical decision making (see chart for details).  Clinical Course as of Jan 17 543  Fri Jan 16, 2020  0609 Patient presenting for evaluation of dizziness.  She has a longstanding history of vertigo since childhood.  Symptoms unrelieved with meclizine prior to arrival, but have since started to improve.  She has a nonfocal neurologic exam and normal head CT.  Complains of some mild nausea, will give Reglan.  Labs were drawn in triage.  Upon review, appears to have some electrolyte derangements with low bicarb, elevated anion gap.  Unclear cause of these changes, but will obtain recheck to assess for normalization while waiting in the department x 10 hours. Otherwise, patient desiring discharge given that vertigo improving.   [KH]  [phone] Repeat chemistry panel has normalized. Stable for discharge.   [KH]    Clinical Course User Index [KH] Antonietta Breach, PA-C   MDM Rules/Calculators/A&P                          Patient with longstanding history of vertigo presents for evaluation of dizziness.  Initially symptoms were unrelieved with meclizine taken at home.  Now reporting significant symptomatic improvement.  Head CT ordered in triage.  This is reassuring.  She has no focal deficits on neurologic exam.  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  She was given a dose of Reglan prior to discharge for management of nausea.  Will refill course of meclizine to use PRN.  Return precautions discussed  and provided. Patient discharged in stable condition with no unaddressed concerns.   Final Clinical Impression(s) /  ED Diagnoses Final diagnoses:  Vertigo    Rx / DC Orders ED Discharge Orders         Ordered    meclizine (ANTIVERT) 12.5 MG tablet  3 times daily PRN        01/16/20 0556           Antonietta Breach, PA-C 41/58/30 [id number]    Delora Fuel, MD 68/08/81 [phone]

## 2020-01-20 ENCOUNTER — Encounter: Payer: Self-pay | Admitting: Nurse Practitioner

## 2020-01-20 NOTE — Progress Notes (Signed)
Rutherford Nail as a scribe for [name], FNP.,have documented all relevant documentation on the behalf of [name], FNP,as directed by  [name], FNP while in the presence of [name], [place]. This visit occurred during the SARS-CoV-2 public health emergency.  Safety protocols were in place, including screening questions prior to the visit, additional usage of staff PPE, and extensive cleaning of exam room while observing appropriate contact time as indicated for disinfecting solutions.  Subjective:     Patient ID: Cindy Cain , female    DOB: 09/11/1960 , 59 y.o.   MRN: [id number]   Chief Complaint  Patient presents with  . 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  She has loose hanging skin to her arms and panus area.   She does remember having a low iron in the past and was hospitalized.    Dizziness This is a new problem. The current episode started in the past 7 days. The problem occurs intermittently. Pertinent negatives include no chest pain or coughing.     Past Medical History:  Diagnosis Date  . Hypertension      Family History  Problem Relation Age of Onset  . COPD Mother   . Hemolytic uremic syndrome Father   . Heart attack Brother   . Diabetes Brother   . Hypertension Brother   . Allergies Son      Current Outpatient Medications:  .  acetaminophen (TYLENOL) 325 MG tablet, Take by mouth., Disp: , Rfl:  .  Blood Glucose Monitoring Suppl (FIFTY50 GLUCOSE METER 2.0) w/Device KIT, DX: Z98.84. E16.1 Use as instructed, Disp: , Rfl:  .  CALCIUM PO, Take 1 tablet by mouth daily., Disp: , Rfl:  .  Cholecalciferol (VITAMIN D3) 125 MCG (5000 UT) CAPS, Take by mouth., Disp: , Rfl:  .  diclofenac sodium (VOLTAREN) 1 % GEL, Apply 2 g topically 4 (four) times  daily as needed., Disp: 100 g, Rfl: 1 .  docusate sodium (COLACE) 100 MG capsule, Take by mouth., Disp: , Rfl:  .  glucose blood (PRECISION QID TEST) test strip, by Other route two (2) times a day as needed., Disp: , Rfl:  .  lisinopril-hydrochlorothiazide (ZESTORETIC) 10-12.5 MG tablet, TAKE 1 TABLET BY MOUTH DAILY, Disp: 90 tablet, Rfl: 1 .  loratadine (CLARITIN) 10 MG tablet, Take 1 tablet (10 mg total) by mouth daily., Disp: 30 tablet, Rfl: 2 .  magnesium oxide (MAG-OX) 400 MG tablet, Take 400 mg by mouth daily., Disp: , Rfl:  .  meclizine (ANTIVERT) 12.5 MG tablet, Take 1 tablet (12.5 mg total) by mouth 3 (three) times daily as needed for dizziness., Disp: 30 tablet, Rfl: 0 .  Multiple Vitamins-Minerals (MULTIVITAMIN WITH MINERALS) tablet, Take 1 tablet by mouth daily., Disp: , Rfl:  .  Thiamine HCl (VITAMIN B-1) 250 MG tablet, Take by mouth., Disp: , Rfl:    Allergies  Allergen Reactions  . Aspirin Other (See Comments)    Ulcers   . Codeine Other (See Comments)    ulcers      . No LMP recorded. Patient has had a hysterectomy.. Negative for Dysmenorrhea and Negative for Menorrhagia. Negative for: breast discharge, breast lump(s), breast pain and breast self exam. Associated symptoms include abnormal  vaginal bleeding. Pertinent negatives include abnormal bleeding (hematology), anxiety, decreased libido, depression, difficulty falling sleep, dyspareunia, history of infertility, nocturia, sexual dysfunction, sleep disturbances, urinary incontinence, urinary urgency, vaginal discharge and vaginal itching. Diet regular; stopped eating meats. She is doing more plant based diet. The patient states her exercise level is cardio/strength training at least 3 times a week.  The patient's tobacco use is:  Social History   Tobacco Use  Smoking Status Never Smoker  Smokeless Tobacco Never Used   She has been exposed to passive smoke. The patient's alcohol use is:  Social History   Substance and  Sexual Activity  Alcohol Use No    Review of Systems  Constitutional: Negative.   HENT: Negative.   Eyes: Negative.   Respiratory: Negative.  Negative for cough.   Cardiovascular: Negative.  Negative for chest pain, palpitations and leg swelling.  Gastrointestinal: Negative.   Endocrine: Negative.   Genitourinary: Negative.   Musculoskeletal: Negative.   Skin: Negative.   Allergic/Immunologic: Negative.   Neurological: Positive for dizziness.  Hematological: Negative.   Psychiatric/Behavioral: Negative.      Today's Vitals   01/21/20 1138  BP: 116/80  Pulse: (!) 59  Temp: (!) 97.4 F (36.3 C)  TempSrc: Oral  Weight: 194 lb 9.6 oz (88.3 kg)  Height: _0  (1.626 m)  PainSc: 0-No pain   Body mass index is 33.4 kg/m.   Objective:  Physical Exam Constitutional:      General: She is not in acute distress.    Appearance: Normal appearance. She is well-developed. She is obese.  HENT:     Head: Normocephalic and atraumatic.     Right Ear: Hearing, tympanic membrane, ear canal and external ear normal. There is no impacted cerumen.     Left Ear: Hearing, tympanic membrane, ear canal and external ear normal. There is no impacted cerumen.     Nose:     Comments: Deferred - masked    Mouth/Throat:     Comments: Deferred - masked Eyes:     General: Lids are normal.     Extraocular Movements: Extraocular movements intact.     Conjunctiva/sclera: Conjunctivae normal.     Pupils: Pupils are equal, round, and reactive to light.     Funduscopic exam:    Right eye: No papilledema.        Left eye: No papilledema.  Neck:     Thyroid: No thyroid mass.     Vascular: No carotid bruit.  Cardiovascular:     Rate and Rhythm: Normal rate and regular rhythm.     Pulses: Normal pulses.     Heart sounds: Normal heart sounds. No murmur heard.   Pulmonary:     Effort: Pulmonary effort is normal.     Breath sounds: Normal breath sounds.  Abdominal:     General: Abdomen is flat.  Bowel sounds are normal. There is no distension.     Palpations: Abdomen is soft.     Tenderness: There is no abdominal tenderness.  Genitourinary:    Rectum: Guaiac result negative.  Musculoskeletal:        General: No swelling. Normal range of motion.     Cervical back: Full passive range of motion without pain, normal range of motion and neck supple.     Right lower leg: No edema.     Left lower leg: No edema.  Skin:    General: Skin is warm and dry.     Capillary Refill: Capillary refill takes less  than 2 seconds.  Neurological:     General: No focal deficit present.     Mental Status: She is alert and oriented to person, place, and time.     Cranial Nerves: No cranial nerve deficit.     Sensory: No sensory deficit.  Psychiatric:        Mood and Affect: Mood normal.        Behavior: Behavior normal.        Thought Content: Thought content normal.        Judgment: Judgment normal.         Assessment And Plan:     1. Health maintenance examination . Behavior modifications discussed and diet history reviewed.   . Pt will continue to exercise regularly and modify diet with low GI, plant based foods and decrease intake of processed foods.  . Recommend intake of daily multivitamin, Vitamin D, and calcium.  . Recommend mammogram and colonoscopy for preventive screenings, as well as recommend immunizations that include influenza, TDAP (up to date)  - VITAMIN D 25 Hydroxy (Vit-D Deficiency, Fractures)  2. Encounter for screening mammogram for malignant neoplasm of breast  Pt instructed on Self Breast Exam.According to ACOG guidelines Women aged 55 and older are recommended to get an annual mammogram. Form completed and given to patient contact the [facility] for appointment scheduing.   Pt encouraged to get annual mammogram - MM DIGITAL SCREENING BILATERAL; Future  3. Encounter for hepatitis C screening test for low risk patient  Will check Hepatitis C screening due to  recent recommendations to screen all adults 18 years and older - Hepatitis C antibody  4. Mixed hyperlipidemia  Chronic, no current medications  Encouraged to avoid fried and fatty foods - CMP14+EGFR - Lipid panel  5. Essential hypertension . B/P is well controlled.  . CMP ordered to check renal function.  . The importance of regular exercise and dietary modification was stressed to the patient.  . Stressed importance of losing ten percent of her body weight to help with B/P control.  . The weight loss would help with decreasing cardiac and cancer risk as well.  . She has a cardiologist and has had an EKG earlier this month - CMP14+EGFR - POCT Urinalysis Dipstick (81002) - POCT UA - Microalbumin  6. BMI 33.0-33.9,adult  She is encouraged to strive for BMI less than 30 to decrease cardiac risk. Advised to aim for at least 150 minutes of exercise per week.  7. Family history of breast cancer - MM DIGITAL SCREENING BILATERAL; Future  8. Dizziness  Will check iron studies and hemoglobin  No abnormal findings on physical exam - CBC - Hemoglobin A1c - Iron, TIBC and Ferritin Panel     Patient was given opportunity to ask questions. Patient verbalized understanding of the plan and was able to repeat key elements of the plan. All questions were answered to their satisfaction.   Teola Bradley, FNP, have reviewed all documentation for this visit. The documentation on 02/04/20 for the exam, diagnosis, procedures, and orders are all accurate and complete.   THE PATIENT IS ENCOURAGED TO PRACTICE SOCIAL DISTANCING DUE TO THE COVID-19 PANDEMIC.

## 2020-01-21 ENCOUNTER — Other Ambulatory Visit: Payer: Self-pay

## 2020-01-21 ENCOUNTER — Ambulatory Visit (INDEPENDENT_AMBULATORY_CARE_PROVIDER_SITE_OTHER): Admitting: Nurse Practitioner

## 2020-01-21 ENCOUNTER — Encounter: Payer: Self-pay | Admitting: Nurse Practitioner

## 2020-01-21 VITALS — BP 116/80 | HR 59 | Temp 97.4°F | Ht 64.0 in | Wt 194.6 lb

## 2020-01-21 DIAGNOSIS — Z23 Encounter for immunization: Secondary | ICD-10-CM

## 2020-01-21 DIAGNOSIS — Z6833 Body mass index (BMI) 33.0-33.9, adult: Secondary | ICD-10-CM

## 2020-01-21 DIAGNOSIS — Z0000 Encounter for general adult medical examination without abnormal findings: Secondary | ICD-10-CM | POA: Diagnosis not present

## 2020-01-21 DIAGNOSIS — Z1159 Encounter for screening for other viral diseases: Secondary | ICD-10-CM

## 2020-01-21 DIAGNOSIS — R42 Dizziness and giddiness: Secondary | ICD-10-CM

## 2020-01-21 DIAGNOSIS — I10 Essential (primary) hypertension: Secondary | ICD-10-CM

## 2020-01-21 DIAGNOSIS — E782 Mixed hyperlipidemia: Secondary | ICD-10-CM | POA: Diagnosis not present

## 2020-01-21 DIAGNOSIS — Z803 Family history of malignant neoplasm of breast: Secondary | ICD-10-CM

## 2020-01-21 DIAGNOSIS — Z1231 Encounter for screening mammogram for malignant neoplasm of breast: Secondary | ICD-10-CM | POA: Diagnosis not present

## 2020-01-21 LAB — POCT URINALYSIS DIPSTICK
Bilirubin, UA: NEGATIVE
Blood, UA: NEGATIVE
Glucose, UA: NEGATIVE
Ketones, UA: NEGATIVE
Nitrite, UA: NEGATIVE
Protein, UA: NEGATIVE
Spec Grav, UA: 1.015 (ref 1.010–1.025)
Urobilinogen, UA: 0.2 E.U./dL
pH, UA: 7 (ref 5.0–8.0)

## 2020-01-21 LAB — POCT UA - MICROALBUMIN
Albumin/Creatinine Ratio, Urine, POC: 30
Creatinine, POC: 50 mg/dL
Microalbumin Ur, POC: 10 mg/L

## 2020-01-21 NOTE — Patient Instructions (Signed)
Health Maintenance, Female Adopting a healthy lifestyle and getting preventive care are important in promoting health and wellness. Ask your health care provider about:  The right schedule for you to have regular tests and exams.  Things you can do on your own to prevent diseases and keep yourself healthy. What should I know about diet, weight, and exercise? Eat a healthy diet   Eat a diet that includes plenty of vegetables, fruits, low-fat dairy products, and lean protein.  Do not eat a lot of foods that are high in solid fats, added sugars, or sodium. Maintain a healthy weight Body mass index (BMI) is used to identify weight problems. It estimates body fat based on height and weight. Your health care provider can help determine your BMI and help you achieve or maintain a healthy weight. Get regular exercise Get regular exercise. This is one of the most important things you can do for your health. Most adults should:  Exercise for at least 150 minutes each week. The exercise should increase your heart rate and make you sweat (moderate-intensity exercise).  Do strengthening exercises at least twice a week. This is in addition to the moderate-intensity exercise.  Spend less time sitting. Even light physical activity can be beneficial. Watch cholesterol and blood lipids Have your blood tested for lipids and cholesterol at 58 years of age, then have this test every 5 years. Have your cholesterol levels checked more often if:  Your lipid or cholesterol levels are high.  You are older than 58 years of age.  You are at high risk for heart disease. What should I know about cancer screening? Depending on your health history and family history, you may need to have cancer screening at various ages. This may include screening for:  Breast cancer.  Cervical cancer.  Colorectal cancer.  Skin cancer.  Lung cancer. What should I know about heart disease, diabetes, and high blood  pressure? Blood pressure and heart disease  High blood pressure causes heart disease and increases the risk of stroke. This is more likely to develop in people who have high blood pressure readings, are of African descent, or are overweight.  Have your blood pressure checked: ? Every 3-5 years if you are 18-39 years of age. ? Every year if you are 40 years old or older. Diabetes Have regular diabetes screenings. This checks your fasting blood sugar level. Have the screening done:  Once every three years after age 40 if you are at a normal weight and have a low risk for diabetes.  More often and at a younger age if you are overweight or have a high risk for diabetes. What should I know about preventing infection? Hepatitis B If you have a higher risk for hepatitis B, you should be screened for this virus. Talk with your health care provider to find out if you are at risk for hepatitis B infection. Hepatitis C Testing is recommended for:  Everyone born from 1945 through 1965.  Anyone with known risk factors for hepatitis C. Sexually transmitted infections (STIs)  Get screened for STIs, including gonorrhea and chlamydia, if: ? You are sexually active and are younger than 58 years of age. ? You are older than 58 years of age and your health care provider tells you that you are at risk for this type of infection. ? Your sexual activity has changed since you were last screened, and you are at increased risk for chlamydia or gonorrhea. Ask your health care provider if   you are at risk.  Ask your health care provider about whether you are at high risk for HIV. Your health care provider may recommend a prescription medicine to help prevent HIV infection. If you choose to take medicine to prevent HIV, you should first get tested for HIV. You should then be tested every 3 months for as long as you are taking the medicine. Pregnancy  If you are about to stop having your period (premenopausal) and  you may become pregnant, seek counseling before you get pregnant.  Take 400 to 800 micrograms (mcg) of folic acid every day if you become pregnant.  Ask for birth control (contraception) if you want to prevent pregnancy. Osteoporosis and menopause Osteoporosis is a disease in which the bones lose minerals and strength with aging. This can result in bone fractures. If you are 65 years old or older, or if you are at risk for osteoporosis and fractures, ask your health care provider if you should:  Be screened for bone loss.  Take a calcium or vitamin D supplement to lower your risk of fractures.  Be given hormone replacement therapy (HRT) to treat symptoms of menopause. Follow these instructions at home: Lifestyle  Do not use any products that contain nicotine or tobacco, such as cigarettes, e-cigarettes, and chewing tobacco. If you need help quitting, ask your health care provider.  Do not use street drugs.  Do not share needles.  Ask your health care provider for help if you need support or information about quitting drugs. Alcohol use  Do not drink alcohol if: ? Your health care provider tells you not to drink. ? You are pregnant, may be pregnant, or are planning to become pregnant.  If you drink alcohol: ? Limit how much you use to 0-1 drink a day. ? Limit intake if you are breastfeeding.  Be aware of how much alcohol is in your drink. In the [place] one drink equals one 12 oz bottle of beer (355 mL), one 5 oz glass of wine (148 mL), or one 1 oz glass of hard liquor (44 mL). General instructions  Schedule regular health, dental, and eye exams.  Stay current with your vaccines.  Tell your health care provider if: ? You often feel depressed. ? You have ever been abused or do not feel safe at home. Summary  Adopting a healthy lifestyle and getting preventive care are important in promoting health and wellness.  Follow your health care provider's instructions about healthy  diet, exercising, and getting tested or screened for diseases.  Follow your health care provider's instructions on monitoring your cholesterol and blood pressure. This information is not intended to replace advice given to you by your health care provider. Make sure you discuss any questions you have with your health care provider. Document Revised: 04/17/2018 Document Reviewed: 04/17/2018 Elsevier Patient Education  2020 Else[employer].  Influenza Virus Vaccine (Flucelvax) What is this medicine? INFLUENZA VIRUS VACCINE (in floo EN zuh VAHY ruhs vak SEEN) helps to reduce the risk of getting influenza also known as the flu. The vaccine only helps protect you against some strains of the flu. This medicine may be used for other purposes; ask your health care provider or pharmacist if you have questions. COMMON BRAND NAME(S): FLUCELVAX What should I tell my health care provider before I take this medicine? They need to know if you have any of these conditions:  bleeding disorder like hemophilia  fever or infection  Guillain-Barre syndrome or other neurological problems    immune system problems  infection with the human immunodeficiency virus (HIV) or AIDS  low blood platelet counts  multiple sclerosis  an unusual or allergic reaction to influenza virus vaccine, other medicines, foods, dyes or preservatives  pregnant or trying to get pregnant  breast-feeding How should I use this medicine? This vaccine is for injection into a muscle. It is given by a health care professional. A copy of Vaccine Information Statements will be given before each vaccination. Read this sheet carefully each time. The sheet may change frequently. Talk to your pediatrician regarding the use of this medicine in children. Special care may be needed. Overdosage: If you think you've taken too much of this medicine contact a poison control center or emergency room at once. Overdosage: If you think you have taken  too much of this medicine contact a poison control center or emergency room at once. NOTE: This medicine is only for you. Do not share this medicine with others. What if I miss a dose? This does not apply. What may interact with this medicine?  chemotherapy or radiation therapy  medicines that lower your immune system like etanercept, anakinra, infliximab, and adalimumab  medicines that treat or prevent blood clots like warfarin  phenytoin  steroid medicines like prednisone or cortisone  theophylline  vaccines This list may not describe all possible interactions. Give your health care provider a list of all the medicines, herbs, non-prescription drugs, or dietary supplements you use. Also tell them if you smoke, drink alcohol, or use illegal drugs. Some items may interact with your medicine. What should I watch for while using this medicine? Report any side effects that do not go away within 3 days to your doctor or health care professional. Call your health care provider if any unusual symptoms occur within 6 weeks of receiving this vaccine. You may still catch the flu, but the illness is not usually as bad. You cannot get the flu from the vaccine. The vaccine will not protect against colds or other illnesses that may cause fever. The vaccine is needed every year. What side effects may I notice from receiving this medicine? Side effects that you should report to your doctor or health care professional as soon as possible:  allergic reactions like skin rash, itching or hives, swelling of the face, lips, or tongue Side effects that usually do not require medical attention (Report these to your doctor or health care professional if they continue or are bothersome.):  fever  headache  muscle aches and pains  pain, tenderness, redness, or swelling at the injection site  tiredness This list may not describe all possible side effects. Call your doctor for medical advice about side  effects. You may report side effects to FDA at 1-800-FDA-1088. Where should I keep my medicine? The vaccine will be given by a health care professional in a clinic, pharmacy, doctor's office, or other health care setting. You will not be given vaccine doses to store at home. NOTE: This sheet is a summary. It may not cover all possible information. If you have questions about this medicine, talk to your doctor, pharmacist, or health care provider.  2020 Elsevier/Gold Standard (2011-04-05 14:06:47)  

## 2020-01-22 LAB — CMP14+EGFR
ALT: 11 IU/L (ref 0–32)
AST: 18 IU/L (ref 0–40)
Albumin/Globulin Ratio: 1.6 (ref 1.2–2.2)
Albumin: 4.4 g/dL (ref 3.8–4.9)
Alkaline Phosphatase: 91 IU/L (ref 44–121)
BUN/Creatinine Ratio: 9 (ref 9–23)
BUN: 9 mg/dL (ref 6–24)
Bilirubin Total: 0.2 mg/dL (ref 0.0–1.2)
CO2: 22 mmol/L (ref 20–29)
Calcium: 9.3 mg/dL (ref 8.7–10.2)
Chloride: 99 mmol/L (ref 96–106)
Creatinine, Ser: 0.97 mg/dL (ref 0.57–1.00)
GFR calc Af Amer: 75 mL/min/{1.73_m2} (ref >59)
GFR calc non Af Amer: 65 mL/min/{1.73_m2} (ref >59)
Globulin, Total: 2.8 g/dL (ref 1.5–4.5)
Glucose: 87 mg/dL (ref 65–99)
Potassium: 4.1 mmol/L (ref 3.5–5.2)
Sodium: 137 mmol/L (ref 134–144)
Total Protein: 7.2 g/dL (ref 6.0–8.5)

## 2020-01-22 LAB — IRON,TIBC AND FERRITIN PANEL
Ferritin: 10 ng/mL — ABNORMAL LOW (ref 15–150)
Iron Saturation: 6 % — CL (ref 15–55)
Iron: 29 ug/dL (ref 27–159)
Total Iron Binding Capacity: 462 ug/dL — ABNORMAL HIGH (ref 250–450)
UIBC: 433 ug/dL — ABNORMAL HIGH (ref 131–425)

## 2020-01-22 LAB — CBC
Hematocrit: 35 % (ref 34.0–46.6)
Hemoglobin: 9.7 g/dL — ABNORMAL LOW (ref 11.1–15.9)
MCH: 20.6 pg — ABNORMAL LOW (ref 26.6–33.0)
MCHC: 27.7 g/dL — ABNORMAL LOW (ref 31.5–35.7)
MCV: 75 fL — ABNORMAL LOW (ref 79–97)
Platelets: 373 10*3/uL (ref 150–450)
RBC: 4.7 x10E6/uL (ref 3.77–5.28)
RDW: 15.7 % — ABNORMAL HIGH (ref 11.7–15.4)
WBC: 4.7 10*3/uL (ref 3.4–10.8)

## 2020-01-22 LAB — LIPID PANEL
Chol/HDL Ratio: 2.9 ratio (ref 0.0–4.4)
Cholesterol, Total: 187 mg/dL (ref 100–199)
HDL: 64 mg/dL (ref >39)
LDL Chol Calc (NIH): 110 mg/dL — ABNORMAL HIGH (ref 0–99)
Triglycerides: 73 mg/dL (ref 0–149)
VLDL Cholesterol Cal: 13 mg/dL (ref 5–40)

## 2020-01-22 LAB — HEMOGLOBIN A1C
Est. average glucose Bld gHb Est-mCnc: 111 mg/dL
Hgb A1c MFr Bld: 5.5 % (ref 4.8–5.6)

## 2020-01-22 LAB — HEPATITIS C ANTIBODY: Hep C Virus Ab: 0.2 s/co ratio (ref 0.0–0.9)

## 2020-01-22 LAB — VITAMIN D 25 HYDROXY (VIT D DEFICIENCY, FRACTURES): Vit D, 25-Hydroxy: 66.2 ng/mL (ref 30.0–100.0)

## 2020-02-09 ENCOUNTER — Ambulatory Visit

## 2020-02-11 ENCOUNTER — Other Ambulatory Visit: Payer: Self-pay

## 2020-02-11 DIAGNOSIS — D649 Anemia, unspecified: Secondary | ICD-10-CM

## 2020-02-13 ENCOUNTER — Telehealth: Payer: Self-pay | Admitting: Hematology

## 2020-02-13 NOTE — Telephone Encounter (Signed)
Received a new hem referral from Arnette Felts, NP for anemia. Ms. Claytor has been cld and scheduled to see Dr. Candise Che on 10/27 at 1pm. Pt aware to arrive 20 minutes early.

## 2020-02-29 NOTE — Progress Notes (Signed)
HEMATOLOGY/ONCOLOGY CONSULTATION NOTE  Date of Service: 02/29/2020  Patient Care Team: [name], FNP as PCP - General (General Practice)  GI- Mickel Duhamel CHIEF COMPLAINTS/PURPOSE OF CONSULTATION:  Anemia  HISTORY OF PRESENTING ILLNESS:   Cindy Cain is a wonderful 59 y.o. female who has been referred to [place] by [name], NP for evaluation and management of anemia.  The pt reports she has been iron deficient for a long time. She notes that she has been on po  Iron for>10 yrs- multivitamin with iron, iron 7m+23 mg, constipation. -heavy periods. Hysterectomy 2005. 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 No overt GI bleeding. No menstrual losses.  MEDICAL HISTORY:  Past Medical History:  Diagnosis Date  . Hypertension     SURGICAL HISTORY: Past Surgical History:  Procedure Laterality Date  . ABDOMINAL HYSTERECTOMY     partial, secondary to bleeding and anemia  . APPENDECTOMY    . CHOLECYSTECTOMY    . GASTRIC BYPASS      SOCIAL HISTORY: Social History   Socioeconomic History  . Marital status: Married    Spouse name: Not on file  . Number of children: Not on file  . Years of education: Not on file  . Highest education level: Not on file  Occupational History  . Not on file  Tobacco Use  . Smoking status: Never Smoker  . Smokeless tobacco: Never Used  Vaping Use  . Vaping Use: Never used  Substance and Sexual Activity  . Alcohol use: No  . Drug use: No  . Sexual activity: Not on file  Other Topics Concern  . Not on file  Social History Narrative  . Not  on file   Social Determinants of Health   Financial Resource Strain:   . Difficulty of Paying Living Expenses: Not on file  Food Insecurity:   . Worried About RCharity fundraiserin the Last Year: Not on file  . Ran Out of Food in the Last Year: Not on file  Transportation Needs:   . Lack of Transportation (Medical): Not on file  . Lack of Transportation (Non-Medical): Not on file  Physical Activity:   . Days of Exercise per Week: Not on file  . Minutes of Exercise per Session: Not on file  Stress:   . Feeling of Stress : Not on file  Social Connections:   . Frequency of Communication with Friends and Family: Not on file  . Frequency of Social Gatherings with Friends and Family: Not on file  . Attends Religious Services: Not on file  . Active Member of Clubs or Organizations: Not on file  . Attends CArchivistMeetings: Not on file  . Marital Status: Not on file  Intimate Partner Violence:   . Fear of Current or Ex-Partner: Not on file  . Emotionally Abused: Not on file  . Physically Abused: Not on file  . Sexually Abused: Not on file    FAMILY HISTORY: Family History  Problem Relation Age of Onset  . COPD Mother   .  Hemolytic uremic syndrome Father   . Heart attack Brother   . Diabetes Brother   . Hypertension Brother   . Allergies Son   maternal uncle - "leukemia" - ?ALL at age 58 yrs  ALLERGIES:  is allergic to aspirin and codeine.  MEDICATIONS:  Current Outpatient Medications  Medication Sig Dispense Refill  . acetaminophen (TYLENOL) 325 MG tablet Take by mouth.    . Blood Glucose Monitoring Suppl (FIFTY50 GLUCOSE METER 2.0) w/Device KIT DX: Z98.84. E16.1 Use as instructed    . CALCIUM PO Take 1 tablet by mouth daily.    . Cholecalciferol (VITAMIN D3) 125 MCG (5000 UT) CAPS Take by mouth.    . diclofenac sodium (VOLTAREN) 1 % GEL Apply 2 g topically 4 (four) times daily as needed. 100 g 1  . docusate sodium (COLACE) 100 MG capsule Take by mouth.    Marland Kitchen  glucose blood (PRECISION QID TEST) test strip by Other route two (2) times a day as needed.    Marland Kitchen lisinopril-hydrochlorothiazide (ZESTORETIC) 10-12.5 MG tablet TAKE 1 TABLET BY MOUTH DAILY 90 tablet 1  . loratadine (CLARITIN) 10 MG tablet Take 1 tablet (10 mg total) by mouth daily. 30 tablet 2  . magnesium oxide (MAG-OX) 400 MG tablet Take 400 mg by mouth daily.    . meclizine (ANTIVERT) 12.5 MG tablet Take 1 tablet (12.5 mg total) by mouth 3 (three) times daily as needed for dizziness. 30 tablet 0  . Multiple Vitamins-Minerals (MULTIVITAMIN WITH MINERALS) tablet Take 1 tablet by mouth daily.    . Thiamine HCl (VITAMIN B-1) 250 MG tablet Take by mouth.     No current facility-administered medications for this visit.    REVIEW OF SYSTEMS:    10 Point review of Systems was done is negative except as noted above.  PHYSICAL EXAMINATION: ECOG PERFORMANCE STATUS: 1 - Symptomatic but completely ambulatory  . Vitals:   03/03/20 1322  BP: 133/68  Pulse: 67  Resp: 18  Temp: 97.9 F (36.6 C)  SpO2: 100%   Filed Weights   03/03/20 1322  Weight: 198 lb 12.8 oz (90.2 kg)   .Body mass index is 34.12 kg/m.  NAD GENERAL:alert, in no acute distress and comfortable SKIN: no acute rashes, no significant lesions EYES: conjunctiva are pink and non-injected, sclera anicteric OROPHARYNX: MMM, no exudates, no oropharyngeal erythema or ulceration NECK: supple, no JVD LYMPH:  no palpable lymphadenopathy in the cervical, axillary or inguinal regions LUNGS: clear to auscultation b/l with normal respiratory effort HEART: regular rate & rhythm ABDOMEN:  normoactive bowel sounds , non tender, not distended. Extremity: no pedal edema PSYCH: alert & oriented x 3 with fluent speech NEURO: no focal motor/sensory deficits  LABORATORY DATA:  I have reviewed the data as listed  . CBC Latest Ref Rng & Units 03/03/2020 03/03/2020 01/21/2020  WBC 4.0 - 10.5 K/uL 4.5 - 4.7  Hemoglobin 12.0 - 15.0 g/dL  9.0(L) - 9.7(L)  Hematocrit 36 - 46 % 31.3(L) 32.0(L) 35.0  Platelets 150 - 400 K/uL 358 - 373    . CMP Latest Ref Rng & Units 03/03/2020 01/21/2020 01/16/2020  Glucose 70 - 99 mg/dL 91 87 -  BUN 6 - 20 mg/dL 7 9 -  Creatinine 0.44 - 1.00 mg/dL 0.94 0.97 -  Sodium 135 - 145 mmol/L 140 137 135  Potassium 3.5 - 5.1 mmol/L 3.8 4.1 4.2  Chloride 98 - 111 mmol/L 106 99 -  CO2 22 - 32 mmol/L 29 22 -  Calcium 8.9 -  10.3 mg/dL 9.7 9.3 -  Total Protein 6.5 - 8.1 g/dL 7.2 7.2 -  Total Bilirubin 0.3 - 1.2 mg/dL 0.3 0.2 -  Alkaline Phos 38 - 126 U/L 92 91 -  AST 15 - 41 U/L 40 18 -  ALT 0 - 44 U/L 37 11 -   . Lab Results  Component Value Date   IRON 72 03/03/2020   TIBC 458 (H) 03/03/2020   IRONPCTSAT 16 (L) 03/03/2020   (Iron and TIBC)  Lab Results  Component Value Date   FERRITIN 7 (L) 03/03/2020   Component     Latest Ref Rng & Units 03/03/2020  Folate, Hemolysate     Not Estab. ng/mL 445.0  HCT     34.0 - 46.6 % 32.0 (L)  Folate, RBC     >498 ng/mL 1,391  Vitamin B12     180 - 914 pg/mL 1,982 (H)     RADIOGRAPHIC STUDIES: I have personally reviewed the radiological images as listed and agreed with the findings in the report. No results found.  ASSESSMENT & PLAN:   59 yo with   1. 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 Cannot r/o GI losses PLAN: -labs reviewed -iron deficiency is severe and not responsive to po iron. -discussed pros, cons and alernative to IV iron-- informed verbal consent obtained for IV Iron -PCP to consider GI workup -  FOLLOW UP: Labs today IV Injectafer weekly x 2 doses  RTC with Dr Irene Limbo with labs in 3 months  All of the patients questions were answered with apparent satisfaction. The patient knows to call the clinic with any problems, questions or concerns.  I spent 35 mins counseling the patient face to face. The total  time spent in the appointment was 45 mins and more than 50% was on counseling and direct patient cares.    Sullivan Lone MD [place] AAHIVMS [facility] Hematology/Oncology Physician [facility]  (Office):       [phone] (Work cell):  [phone] (Fax):           [phone]  02/29/2020 7:05 PM  I, Yevette Edwards, am acting as a scribe for Dr. Sullivan Lone.   .I have reviewed the above documentation for accuracy and completeness, and I agree with the above. Brunetta Genera MD

## 2020-03-03 ENCOUNTER — Inpatient Hospital Stay: Attending: Hematology | Admitting: Hematology

## 2020-03-03 ENCOUNTER — Other Ambulatory Visit: Payer: Self-pay

## 2020-03-03 ENCOUNTER — Inpatient Hospital Stay

## 2020-03-03 VITALS — BP 133/68 | HR 67 | Temp 97.9°F | Resp 18 | Ht 64.0 in | Wt 198.8 lb

## 2020-03-03 DIAGNOSIS — I10 Essential (primary) hypertension: Secondary | ICD-10-CM | POA: Diagnosis not present

## 2020-03-03 DIAGNOSIS — D509 Iron deficiency anemia, unspecified: Secondary | ICD-10-CM

## 2020-03-03 DIAGNOSIS — Z9884 Bariatric surgery status: Secondary | ICD-10-CM | POA: Diagnosis not present

## 2020-03-03 LAB — CBC WITH DIFFERENTIAL/PLATELET
Abs Immature Granulocytes: 0.01 K/uL (ref 0.00–0.07)
Basophils Absolute: 0.1 K/uL (ref 0.0–0.1)
Basophils Relative: 2 %
Eosinophils Absolute: 0.1 K/uL (ref 0.0–0.5)
Eosinophils Relative: 3 %
HCT: 31.3 % — ABNORMAL LOW (ref 36.0–46.0)
Hemoglobin: 9 g/dL — ABNORMAL LOW (ref 12.0–15.0)
Immature Granulocytes: 0 %
Lymphocytes Relative: 47 %
Lymphs Abs: 2.1 K/uL (ref 0.7–4.0)
MCH: 20.3 pg — ABNORMAL LOW (ref 26.0–34.0)
MCHC: 28.8 g/dL — ABNORMAL LOW (ref 30.0–36.0)
MCV: 70.5 fL — ABNORMAL LOW (ref 80.0–100.0)
Monocytes Absolute: 0.5 K/uL (ref 0.1–1.0)
Monocytes Relative: 11 %
Neutro Abs: 1.7 K/uL (ref 1.7–7.7)
Neutrophils Relative %: 37 %
Platelets: 358 K/uL (ref 150–400)
RBC: 4.44 MIL/uL (ref 3.87–5.11)
RDW: 18.3 % — ABNORMAL HIGH (ref 11.5–15.5)
WBC: 4.5 K/uL (ref 4.0–10.5)
nRBC: 0 % (ref 0.0–0.2)

## 2020-03-03 LAB — IRON AND TIBC
Iron: 72 ug/dL (ref 41–142)
Saturation Ratios: 16 % — ABNORMAL LOW (ref 21–57)
TIBC: 458 ug/dL — ABNORMAL HIGH (ref 236–444)
UIBC: 386 ug/dL — ABNORMAL HIGH (ref 120–384)

## 2020-03-03 LAB — CMP (CANCER CENTER ONLY)
ALT: 37 U/L (ref 0–44)
AST: 40 U/L (ref 15–41)
Albumin: 3.6 g/dL (ref 3.5–5.0)
Alkaline Phosphatase: 92 U/L (ref 38–126)
Anion gap: 5 (ref 5–15)
BUN: 7 mg/dL (ref 6–20)
CO2: 29 mmol/L (ref 22–32)
Calcium: 9.7 mg/dL (ref 8.9–10.3)
Chloride: 106 mmol/L (ref 98–111)
Creatinine: 0.94 mg/dL (ref 0.44–1.00)
GFR, Estimated: >60 mL/min
Glucose, Bld: 91 mg/dL (ref 70–99)
Potassium: 3.8 mmol/L (ref 3.5–5.1)
Sodium: 140 mmol/L (ref 135–145)
Total Bilirubin: 0.3 mg/dL (ref 0.3–1.2)
Total Protein: 7.2 g/dL (ref 6.5–8.1)

## 2020-03-03 LAB — FERRITIN: Ferritin: 7 ng/mL — ABNORMAL LOW (ref 11–307)

## 2020-03-03 LAB — VITAMIN B12: Vitamin B-12: 1982 pg/mL — ABNORMAL HIGH (ref 180–914)

## 2020-03-04 ENCOUNTER — Telehealth: Payer: Self-pay | Admitting: Hematology

## 2020-03-04 DIAGNOSIS — D509 Iron deficiency anemia, unspecified: Secondary | ICD-10-CM | POA: Insufficient documentation

## 2020-03-04 LAB — FOLATE RBC
Folate, Hemolysate: 445 ng/mL
Folate, RBC: 1391 ng/mL (ref >498)
Hematocrit: 32 % — ABNORMAL LOW (ref 34.0–46.6)

## 2020-03-04 NOTE — Telephone Encounter (Signed)
Scheduled per 10/27 los. Pt is aware of appt times and dates. 

## 2020-03-08 ENCOUNTER — Encounter: Payer: Self-pay | Admitting: Hematology

## 2020-03-09 ENCOUNTER — Telehealth: Payer: Self-pay | Admitting: *Deleted

## 2020-03-09 NOTE — Telephone Encounter (Signed)
Contacted patient in response to MyChart message regarding B12 level with Dr. Clyda Greener response: "B12 could be high due to over replacement or increased excessive dietary intake. No clear significance at this time."   Patient verbalized understanding. Encouraged patient to review all supplements for over replacement and decrease accordingly and contact office for further questions.

## 2020-03-10 ENCOUNTER — Other Ambulatory Visit: Payer: Self-pay

## 2020-03-10 ENCOUNTER — Inpatient Hospital Stay: Attending: Hematology

## 2020-03-10 VITALS — BP 112/57 | HR 87 | Temp 98.1°F | Resp 17 | Wt 200.8 lb

## 2020-03-10 DIAGNOSIS — D509 Iron deficiency anemia, unspecified: Secondary | ICD-10-CM | POA: Diagnosis not present

## 2020-03-10 MED ORDER — SODIUM CHLORIDE 0.9 % IV SOLN
Freq: Once | INTRAVENOUS | Status: AC
  Filled 2020-03-10: qty 250

## 2020-03-10 MED ORDER — ACETAMINOPHEN 325 MG PO TABS
ORAL_TABLET | ORAL | Status: AC
  Filled 2020-03-10: qty 2

## 2020-03-10 MED ORDER — LORATADINE 10 MG PO TABS
ORAL_TABLET | ORAL | Status: AC
  Filled 2020-03-10: qty 1

## 2020-03-10 MED ORDER — LORATADINE 10 MG PO TABS
10.0000 mg | ORAL_TABLET | Freq: Once | ORAL | Status: AC
  Administered 2020-03-10: 10 mg via ORAL

## 2020-03-10 MED ORDER — SODIUM CHLORIDE 0.9 % IV SOLN
750.0000 mg | Freq: Once | INTRAVENOUS | Status: AC
  Administered 2020-03-10: 750 mg via INTRAVENOUS
  Filled 2020-03-10: qty 15

## 2020-03-10 MED ORDER — ACETAMINOPHEN 325 MG PO TABS
650.0000 mg | ORAL_TABLET | Freq: Once | ORAL | Status: AC
  Administered 2020-03-10: 650 mg via ORAL

## 2020-03-10 NOTE — Patient Instructions (Signed)

## 2020-03-18 ENCOUNTER — Other Ambulatory Visit: Payer: Self-pay

## 2020-03-18 ENCOUNTER — Ambulatory Visit

## 2020-03-18 ENCOUNTER — Inpatient Hospital Stay

## 2020-03-18 VITALS — BP 142/69 | HR 48 | Temp 98.1°F | Resp 18

## 2020-03-18 DIAGNOSIS — D509 Iron deficiency anemia, unspecified: Secondary | ICD-10-CM

## 2020-03-18 MED ORDER — SODIUM CHLORIDE 0.9 % IV SOLN
750.0000 mg | Freq: Once | INTRAVENOUS | Status: AC
  Administered 2020-03-18: 750 mg via INTRAVENOUS
  Filled 2020-03-18: qty 15

## 2020-03-18 MED ORDER — ACETAMINOPHEN 325 MG PO TABS
ORAL_TABLET | ORAL | Status: AC
  Filled 2020-03-18: qty 2

## 2020-03-18 MED ORDER — LORATADINE 10 MG PO TABS
10.0000 mg | ORAL_TABLET | Freq: Once | ORAL | Status: AC
  Administered 2020-03-18: 10 mg via ORAL

## 2020-03-18 MED ORDER — SODIUM CHLORIDE 0.9 % IV SOLN
Freq: Once | INTRAVENOUS | Status: AC
  Filled 2020-03-18: qty 250

## 2020-03-18 MED ORDER — ACETAMINOPHEN 325 MG PO TABS
650.0000 mg | ORAL_TABLET | Freq: Once | ORAL | Status: AC
  Administered 2020-03-18: 650 mg via ORAL

## 2020-03-18 MED ORDER — LORATADINE 10 MG PO TABS
ORAL_TABLET | ORAL | Status: AC
  Filled 2020-03-18: qty 1

## 2020-03-18 NOTE — Patient Instructions (Signed)

## 2020-03-24 MED ORDER — PROPOFOL 10 MG/ML IV BOLUS
INTRAVENOUS | Status: AC
  Filled 2020-03-24: qty 20

## 2020-03-24 MED ORDER — SCOPOLAMINE 1 MG/3DAYS TD PT72
MEDICATED_PATCH | TRANSDERMAL | Status: AC
  Filled 2020-03-24: qty 1

## 2020-03-24 MED ORDER — FENTANYL CITRATE (PF) 250 MCG/5ML IJ SOLN
INTRAMUSCULAR | Status: AC
  Filled 2020-03-24: qty 5

## 2020-03-24 MED ORDER — KETOROLAC TROMETHAMINE 30 MG/ML IJ SOLN
INTRAMUSCULAR | Status: AC
  Filled 2020-03-24: qty 1

## 2020-03-24 MED ORDER — GLYCOPYRROLATE PF 0.2 MG/ML IJ SOSY
PREFILLED_SYRINGE | INTRAMUSCULAR | Status: AC
  Filled 2020-03-24: qty 1

## 2020-03-24 MED ORDER — MIDAZOLAM HCL 2 MG/2ML IJ SOLN
INTRAMUSCULAR | Status: AC
  Filled 2020-03-24: qty 2

## 2020-04-26 ENCOUNTER — Other Ambulatory Visit: Payer: Self-pay | Admitting: Nurse Practitioner

## 2020-04-26 DIAGNOSIS — I10 Essential (primary) hypertension: Secondary | ICD-10-CM

## 2020-05-07 LAB — HM COLONOSCOPY

## 2020-05-10 ENCOUNTER — Encounter: Payer: Self-pay | Admitting: Nurse Practitioner

## 2020-05-12 ENCOUNTER — Encounter: Payer: Self-pay | Admitting: Nurse Practitioner

## 2020-06-02 NOTE — Progress Notes (Signed)
HEMATOLOGY/ONCOLOGY CONSULTATION NOTE  Date of Service: 06/02/2020  Patient Care Team: [name], FNP as PCP - General (General Practice)  GI- Mickel Duhamel CHIEF COMPLAINTS/PURPOSE OF CONSULTATION:  Anemia  HISTORY OF PRESENTING ILLNESS:   Cindy Cain is a wonderful 60 y.o. female who has been referred to [place] by [name], NP for evaluation and management of anemia.  The pt reports she has been iron deficient for a long time. She notes that she has been on po  Iron for>10 yrs- multivitamin with iron, iron 5m+23 mg, constipation. -heavy periods. Hysterectomy 2005. 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 No overt GI bleeding. No menstrual losses.  Interval History  Cindy MARSHMANis a 60y.o. female who is here today for evaluation and management of anemia. The patient's last visit with [place] on 03/03/2020. The pt reports that she is doing well overall.  The pt reports no new symptoms or concerns. Her energy levels have improved since getting the IV Iron. She notes that her energy levels have been slowly decreasing lately, but not to the prior low levels before IV Iron. The pt denies any abnormal cravings.  The pt notes that she recently had a Colonoscopy on 05/07/2020. The results have not been scanned in on this yet. The pt notes that this was normal and she does not need a repeat for 10 years.  The pt notes that she has muscle cramps frequently in her legs. She denies any statin medications.  The pt notes that she has not been following her Plant-based diet over the holidays,  but has recently resumed this. The pt has received her COVID vaccines and booster.  Lab results today 06/03/2020 of CBC w/diff is as follows: all values are WNL except for RDW of 18.0, Neutro Abs of 1.2K. 06/03/2020 Ferritin improved to 194 (up from 7) 06/03/2020 Iron of 121 and Sat Ratio of 41.  On review of systems, pt reports mild fatigue and denies changes in bowel habits, bloody/black stools, abdominal pain, back pain, leg swelling and any other symptoms.  MEDICAL HISTORY:  Past Medical History:  Diagnosis Date  . Hypertension     SURGICAL HISTORY: Past Surgical History:  Procedure Laterality Date  . ABDOMINAL HYSTERECTOMY     partial, secondary to bleeding and anemia  . APPENDECTOMY    . CHOLECYSTECTOMY    . GASTRIC BYPASS      SOCIAL HISTORY: Social History   Socioeconomic History  . Marital status: Married    Spouse name: Not on file  . Number of children: Not on file  . Years of education: Not on file  . Highest education level: Not on file  Occupational History  . Not on file  Tobacco Use  . Smoking status: Never Smoker  . Smokeless tobacco: Never Used  Vaping Use  . Vaping Use: Never used  Substance and Sexual Activity  . Alcohol use: No  . Drug use: No  . Sexual activity: Not on file  Other Topics Concern  . Not  on file  Social History Narrative  . Not on file   Social Determinants of Health   Financial Resource Strain: Not on file  Food Insecurity: Not on file  Transportation Needs: Not on file  Physical Activity: Not on file  Stress: Not on file  Social Connections: Not on file  Intimate Partner Violence: Not on file    FAMILY HISTORY: Family History  Problem Relation Age of Onset  . COPD Mother   . Hemolytic uremic syndrome Father   . Heart attack Brother   . Diabetes Brother   . Hypertension Brother   . Allergies Son   maternal uncle - "leukemia" - ?ALL at age 19 yrs  ALLERGIES:  is allergic to aspirin and codeine.  MEDICATIONS:   Current Outpatient Medications  Medication Sig Dispense Refill  . acetaminophen (TYLENOL) 325 MG tablet Take by mouth.    . Blood Glucose Monitoring Suppl (FIFTY50 GLUCOSE METER 2.0) w/Device KIT DX: Z98.84. E16.1 Use as instructed    . CALCIUM PO Take 1 tablet by mouth daily.    . Cholecalciferol (VITAMIN D3) 125 MCG (5000 UT) CAPS Take by mouth.    . diclofenac sodium (VOLTAREN) 1 % GEL Apply 2 g topically 4 (four) times daily as needed. 100 g 1  . docusate sodium (COLACE) 100 MG capsule Take by mouth.    Marland Kitchen glucose blood (PRECISION QID TEST) test strip by Other route two (2) times a day as needed.    Marland Kitchen lisinopril-hydrochlorothiazide (ZESTORETIC) 10-12.5 MG tablet TAKE 1 TABLET BY MOUTH DAILY 90 tablet 1  . loratadine (CLARITIN) 10 MG tablet Take 1 tablet (10 mg total) by mouth daily. 30 tablet 2  . magnesium oxide (MAG-OX) 400 MG tablet Take 400 mg by mouth daily.    . meclizine (ANTIVERT) 12.5 MG tablet Take 1 tablet (12.5 mg total) by mouth 3 (three) times daily as needed for dizziness. 30 tablet 0  . Multiple Vitamins-Minerals (MULTIVITAMIN WITH MINERALS) tablet Take 1 tablet by mouth daily.    . Thiamine HCl (VITAMIN B-1) 250 MG tablet Take by mouth.     No current facility-administered medications for this visit.    REVIEW OF SYSTEMS:   10 Point review of Systems was done is negative except as noted above.  PHYSICAL EXAMINATION: ECOG PERFORMANCE STATUS: 1 - Symptomatic but completely ambulatory  . Vitals:   06/03/20 1456  BP: 129/82  Pulse: 60  Resp: 18  Temp: 98.1 F (36.7 C)  SpO2: 100%   Filed Weights   06/03/20 1456  Weight: 210 lb 3.2 oz (95.3 kg)   .Body mass index is 36.08 kg/m.   GENERAL:alert, in no acute distress and comfortable SKIN: no acute rashes, no significant lesions EYES: conjunctiva are pink and non-injected, sclera anicteric OROPHARYNX: MMM, no exudates, no oropharyngeal erythema or ulceration NECK: supple, no JVD LYMPH:  no palpable  lymphadenopathy in the cervical, axillary or inguinal regions LUNGS: clear to auscultation b/l with normal respiratory effort HEART: regular rate & rhythm ABDOMEN:  normoactive bowel sounds , non tender, not distended. Extremity: no pedal edema PSYCH: alert & oriented x 3 with fluent speech NEURO: no focal motor/sensory deficits   LABORATORY DATA:  I have reviewed the data as listed  . CBC Latest Ref Rng & Units 06/03/2020 03/03/2020 03/03/2020  WBC 4.0 - 10.5 K/uL 4.0 4.5 -  Hemoglobin 12.0 - 15.0 g/dL 13.3 9.0(L) -  Hematocrit 36.0 - 46.0 % 42.3 31.3(L) 32.0(L)  Platelets 150 - 400 K/uL 233  358 -    . CMP Latest Ref Rng & Units 03/03/2020 01/21/2020 01/16/2020  Glucose 70 - 99 mg/dL 91 87 -  BUN 6 - 20 mg/dL 7 9 -  Creatinine 0.44 - 1.00 mg/dL 0.94 0.97 -  Sodium 135 - 145 mmol/L 140 137 135  Potassium 3.5 - 5.1 mmol/L 3.8 4.1 4.2  Chloride 98 - 111 mmol/L 106 99 -  CO2 22 - 32 mmol/L 29 22 -  Calcium 8.9 - 10.3 mg/dL 9.7 9.3 -  Total Protein 6.5 - 8.1 g/dL 7.2 7.2 -  Total Bilirubin 0.3 - 1.2 mg/dL 0.3 0.2 -  Alkaline Phos 38 - 126 U/L 92 91 -  AST 15 - 41 U/L 40 18 -  ALT 0 - 44 U/L 37 11 -   . Lab Results  Component Value Date   IRON 121 06/03/2020   TIBC 293 06/03/2020   IRONPCTSAT 41 06/03/2020   (Iron and TIBC)  Lab Results  Component Value Date   FERRITIN 194 06/03/2020    . Lab Results  Component Value Date   IRON 72 03/03/2020   TIBC 458 (H) 03/03/2020   IRONPCTSAT 16 (L) 03/03/2020   (Iron and TIBC)  Lab Results  Component Value Date   FERRITIN 7 (L) 03/03/2020   Component     Latest Ref Rng & Units 03/03/2020  Folate, Hemolysate     Not Estab. ng/mL 445.0  HCT     34.0 - 46.6 % 32.0 (L)  Folate, RBC     >498 ng/mL 1,391  Vitamin B12     180 - 914 pg/mL 1,982 (H)     RADIOGRAPHIC STUDIES: I have personally reviewed the radiological images as listed and agreed with the findings in the report. No results found.  ASSESSMENT & PLAN:    60 yo with   1. 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 Cannot r/o GI losses  PLAN: -Discussed labwork today, 06/03/2020 Hgb has normalized. Blood is carrying 30-40% more oxygen. Iron levels are improved. Sat is above 30. Borderline low Neutrophil count. Ferritin is 194. -no indication for additional IV Iron at this time. -Advised pt that we are not worried regarding elevated Vitamin B12 levels.  -Continue Vitamin B12 at half-normal dose.  -Discussed pt's recent colonoscopy. Results not scanned in yet, but normal per patients understanding. -Will continue to monitor Iron levels to observe if more IV Iron is needed in the future. -Advised pt that we will observe levels at six months. If still stable, will be able to switch visit to annually.  -Advised pt that Iron Polysaccharide supplementation may not be as effective due to lack of absorption in past. -Recommend pt stay hydrated and increase electrolytes to aid with muscle cramps. -Will see back in 6 months with labs.   FOLLOW UP: RTC with Dr Irene Limbo with labs in 6 months  All of the patients questions were answered with apparent satisfaction. The patient knows to call the clinic with any problems, questions or concerns.  The total time spent in the appointment was 20 minutes and more than 50% was on counseling and direct patient cares.   Sullivan Lone MD [place] AAHIVMS [facility] Hematology/Oncology Physician [facility]  (Office):       [phone] (Work cell):  [phone] (Fax):           [phone]  06/02/2020 12:03 PM  I,  Reinaldo Raddle, am acting as scribe for Dr. Sullivan Lone, MD.   .I have reviewed the above documentation for accuracy and completeness, and I agree with the above. Brunetta Genera MD

## 2020-06-03 ENCOUNTER — Inpatient Hospital Stay: Attending: Hematology | Admitting: Hematology

## 2020-06-03 ENCOUNTER — Inpatient Hospital Stay

## 2020-06-03 ENCOUNTER — Other Ambulatory Visit: Payer: Self-pay

## 2020-06-03 VITALS — BP 129/82 | HR 60 | Temp 98.1°F | Resp 18 | Ht 64.0 in | Wt 210.2 lb

## 2020-06-03 DIAGNOSIS — E538 Deficiency of other specified B group vitamins: Secondary | ICD-10-CM

## 2020-06-03 DIAGNOSIS — D509 Iron deficiency anemia, unspecified: Secondary | ICD-10-CM | POA: Insufficient documentation

## 2020-06-03 DIAGNOSIS — R252 Cramp and spasm: Secondary | ICD-10-CM | POA: Insufficient documentation

## 2020-06-03 DIAGNOSIS — I10 Essential (primary) hypertension: Secondary | ICD-10-CM | POA: Insufficient documentation

## 2020-06-03 DIAGNOSIS — Z9884 Bariatric surgery status: Secondary | ICD-10-CM | POA: Diagnosis not present

## 2020-06-03 LAB — CBC WITH DIFFERENTIAL/PLATELET
Abs Immature Granulocytes: 0 10*3/uL (ref 0.00–0.07)
Basophils Absolute: 0 10*3/uL (ref 0.0–0.1)
Basophils Relative: 1 %
Eosinophils Absolute: 0.1 10*3/uL (ref 0.0–0.5)
Eosinophils Relative: 3 %
HCT: 42.3 % (ref 36.0–46.0)
Hemoglobin: 13.3 g/dL (ref 12.0–15.0)
Immature Granulocytes: 0 %
Lymphocytes Relative: 56 %
Lymphs Abs: 2.3 10*3/uL (ref 0.7–4.0)
MCH: 26.9 pg (ref 26.0–34.0)
MCHC: 31.4 g/dL (ref 30.0–36.0)
MCV: 85.5 fL (ref 80.0–100.0)
Monocytes Absolute: 0.3 10*3/uL (ref 0.1–1.0)
Monocytes Relative: 9 %
Neutro Abs: 1.2 10*3/uL — ABNORMAL LOW (ref 1.7–7.7)
Neutrophils Relative %: 31 %
Platelets: 233 10*3/uL (ref 150–400)
RBC: 4.95 MIL/uL (ref 3.87–5.11)
RDW: 18 % — ABNORMAL HIGH (ref 11.5–15.5)
WBC: 4 10*3/uL (ref 4.0–10.5)
nRBC: 0 % (ref 0.0–0.2)

## 2020-06-03 LAB — FERRITIN: Ferritin: 194 ng/mL (ref 11–307)

## 2020-06-03 LAB — IRON AND TIBC
Iron: 121 ug/dL (ref 41–142)
Saturation Ratios: 41 % (ref 21–57)
TIBC: 293 ug/dL (ref 236–444)
UIBC: 172 ug/dL (ref 120–384)

## 2020-06-09 ENCOUNTER — Other Ambulatory Visit: Payer: Self-pay | Admitting: Internal Medicine

## 2020-06-09 DIAGNOSIS — Z1231 Encounter for screening mammogram for malignant neoplasm of breast: Secondary | ICD-10-CM

## 2020-07-05 ENCOUNTER — Other Ambulatory Visit: Payer: Self-pay

## 2020-07-05 ENCOUNTER — Ambulatory Visit
Admission: RE | Admit: 2020-07-05 | Discharge: 2020-07-05 | Disposition: A | Discharge status: Home / Self Care | Source: Ambulatory Visit

## 2020-07-05 DIAGNOSIS — Z1231 Encounter for screening mammogram for malignant neoplasm of breast: Secondary | ICD-10-CM

## 2020-07-21 ENCOUNTER — Ambulatory Visit: Admitting: Nurse Practitioner

## 2020-09-29 ENCOUNTER — Other Ambulatory Visit: Payer: Self-pay | Admitting: Nurse Practitioner

## 2020-09-29 DIAGNOSIS — I10 Essential (primary) hypertension: Secondary | ICD-10-CM

## 2020-11-24 ENCOUNTER — Encounter: Payer: Self-pay | Admitting: Hematology

## 2020-12-01 ENCOUNTER — Inpatient Hospital Stay: Payer: Self-pay | Admitting: Hematology

## 2020-12-01 ENCOUNTER — Inpatient Hospital Stay: Payer: Self-pay | Attending: Hematology

## 2020-12-02 ENCOUNTER — Encounter: Payer: Self-pay | Admitting: Hematology

## 2021-01-27 ENCOUNTER — Encounter: Admitting: Internal Medicine

## 2021-01-27 ENCOUNTER — Encounter: Admitting: Nurse Practitioner

## 2021-02-15 ENCOUNTER — Other Ambulatory Visit: Payer: Self-pay

## 2021-02-15 ENCOUNTER — Encounter: Payer: Self-pay | Admitting: Nurse Practitioner

## 2021-02-15 ENCOUNTER — Ambulatory Visit (INDEPENDENT_AMBULATORY_CARE_PROVIDER_SITE_OTHER): Admitting: Nurse Practitioner

## 2021-02-15 ENCOUNTER — Encounter: Payer: Self-pay | Admitting: Hematology

## 2021-02-15 VITALS — BP 134/80 | HR 57 | Temp 98.2°F | Ht 64.0 in | Wt 202.4 lb

## 2021-02-15 DIAGNOSIS — E6609 Other obesity due to excess calories: Secondary | ICD-10-CM

## 2021-02-15 DIAGNOSIS — I10 Essential (primary) hypertension: Secondary | ICD-10-CM

## 2021-02-15 DIAGNOSIS — Z23 Encounter for immunization: Secondary | ICD-10-CM | POA: Diagnosis not present

## 2021-02-15 DIAGNOSIS — E782 Mixed hyperlipidemia: Secondary | ICD-10-CM

## 2021-02-15 DIAGNOSIS — N951 Menopausal and female climacteric states: Secondary | ICD-10-CM | POA: Diagnosis not present

## 2021-02-15 DIAGNOSIS — Z0000 Encounter for general adult medical examination without abnormal findings: Secondary | ICD-10-CM | POA: Diagnosis not present

## 2021-02-15 DIAGNOSIS — Z6834 Body mass index (BMI) 34.0-34.9, adult: Secondary | ICD-10-CM

## 2021-02-15 LAB — POCT URINALYSIS DIPSTICK
Bilirubin, UA: NEGATIVE
Blood, UA: NEGATIVE
Glucose, UA: NEGATIVE
Ketones, UA: NEGATIVE
Leukocytes, UA: NEGATIVE
Nitrite, UA: NEGATIVE
Protein, UA: NEGATIVE
Spec Grav, UA: 1.02 (ref 1.010–1.025)
Urobilinogen, UA: 0.2 E.U./dL
pH, UA: 7.5 (ref 5.0–8.0)

## 2021-02-15 LAB — POCT UA - MICROALBUMIN
Albumin/Creatinine Ratio, Urine, POC: <30
Creatinine, POC: 200 mg/dL
Microalbumin Ur, POC: 30 mg/L

## 2021-02-15 NOTE — Patient Instructions (Addendum)
Health Maintenance, Female Adopting a healthy lifestyle and getting preventive care are important in promoting health and wellness. Ask your health care provider about: The right schedule for you to have regular tests and exams. Things you can do on your own to prevent diseases and keep yourself healthy. What should I know about diet, weight, and exercise? Eat a healthy diet  Eat a diet that includes plenty of vegetables, fruits, low-fat dairy products, and lean protein. Do not eat a lot of foods that are high in solid fats, added sugars, or sodium. Maintain a healthy weight Body mass index (BMI) is used to identify weight problems. It estimates body fat based on height and weight. Your health care provider can help determine your BMI and help you achieve or maintain a healthy weight. Get regular exercise Get regular exercise. This is one of the most important things you can do for your health. Most adults should: Exercise for at least 150 minutes each week. The exercise should increase your heart rate and make you sweat (moderate-intensity exercise). Do strengthening exercises at least twice a week. This is in addition to the moderate-intensity exercise. Spend less time sitting. Even light physical activity can be beneficial. Watch cholesterol and blood lipids Have your blood tested for lipids and cholesterol at 59 years of age, then have this test every 5 years. Have your cholesterol levels checked more often if: Your lipid or cholesterol levels are high. You are older than 59 years of age. You are at high risk for heart disease. What should I know about cancer screening? Depending on your health history and family history, you may need to have cancer screening at various ages. This may include screening for: Breast cancer. Cervical cancer. Colorectal cancer. Skin cancer. Lung cancer. What should I know about heart disease, diabetes, and high blood pressure? Blood pressure and heart  disease High blood pressure causes heart disease and increases the risk of stroke. This is more likely to develop in people who have high blood pressure readings, are of African descent, or are overweight. Have your blood pressure checked: Every 3-5 years if you are 18-39 years of age. Every year if you are 40 years old or older. Diabetes Have regular diabetes screenings. This checks your fasting blood sugar level. Have the screening done: Once every three years after age 40 if you are at a normal weight and have a low risk for diabetes. More often and at a younger age if you are overweight or have a high risk for diabetes. What should I know about preventing infection? Hepatitis B If you have a higher risk for hepatitis B, you should be screened for this virus. Talk with your health care provider to find out if you are at risk for hepatitis B infection. Hepatitis C Testing is recommended for: Everyone born from 1945 through 1965. Anyone with known risk factors for hepatitis C. Sexually transmitted infections (STIs) Get screened for STIs, including gonorrhea and chlamydia, if: You are sexually active and are younger than 59 years of age. You are older than 59 years of age and your health care provider tells you that you are at risk for this type of infection. Your sexual activity has changed since you were last screened, and you are at increased risk for chlamydia or gonorrhea. Ask your health care provider if you are at risk. Ask your health care provider about whether you are at high risk for HIV. Your health care provider may recommend a prescription medicine   to help prevent HIV infection. If you choose to take medicine to prevent HIV, you should first get tested for HIV. You should then be tested every 3 months for as long as you are taking the medicine. Pregnancy If you are about to stop having your period (premenopausal) and you may become pregnant, seek counseling before you get  pregnant. Take 400 to 800 micrograms (mcg) of folic acid every day if you become pregnant. Ask for birth control (contraception) if you want to prevent pregnancy. Osteoporosis and menopause Osteoporosis is a disease in which the bones lose minerals and strength with aging. This can result in bone fractures. If you are 65 years old or older, or if you are at risk for osteoporosis and fractures, ask your health care provider if you should: Be screened for bone loss. Take a calcium or vitamin D supplement to lower your risk of fractures. Be given hormone replacement therapy (HRT) to treat symptoms of menopause. Follow these instructions at home: Lifestyle Do not use any products that contain nicotine or tobacco, such as cigarettes, e-cigarettes, and chewing tobacco. If you need help quitting, ask your health care provider. Do not use street drugs. Do not share needles. Ask your health care provider for help if you need support or information about quitting drugs. Alcohol use Do not drink alcohol if: Your health care provider tells you not to drink. You are pregnant, may be pregnant, or are planning to become pregnant. If you drink alcohol: Limit how much you use to 0-1 drink a day. Limit intake if you are breastfeeding. Be aware of how much alcohol is in your drink. In the [place] one drink equals one 12 oz bottle of beer (355 mL), one 5 oz glass of wine (148 mL), or one 1 oz glass of hard liquor (44 mL). General instructions Schedule regular health, dental, and eye exams. Stay current with your vaccines. Tell your health care provider if: You often feel depressed. You have ever been abused or do not feel safe at home. Summary Adopting a healthy lifestyle and getting preventive care are important in promoting health and wellness. Follow your health care provider's instructions about healthy diet, exercising, and getting tested or screened for diseases. Follow your health care provider's  instructions on monitoring your cholesterol and blood pressure. This information is not intended to replace advice given to you by your health care provider. Make sure you discuss any questions you have with your health care provider. Document Revised: 07/02/2020 Document Reviewed: 04/17/2018 Elsevier Patient Education  2022 Else[employer] Inc.  Influenza (Flu) Vaccine (Inactivated or Recombinant): What You Need to Know 1. Why get vaccinated? Influenza vaccine can prevent influenza (flu). Flu is a contagious disease that spreads around the [place] every year, usually between October and May. Anyone can get the flu, but it is more dangerous for some people. Infants and young children, people 65 years and older, pregnant people, and people with certain health conditions or a weakened immune system are at greatest risk of flu complications. Pneumonia, bronchitis, sinus infections, and ear infections are examples of flu-related complications. If you have a medical condition, such as heart disease, cancer, or diabetes, flu can make it worse. Flu can cause fever and chills, sore throat, muscle aches, fatigue, cough, headache, and runny or stuffy nose. Some people may have vomiting and diarrhea, though this is more common in children than adults. In an average year, thousands of people in the [place] die from flu, and many   more are hospitalized. Flu vaccine prevents millions of illnesses and flu-related visits to the doctor each year. 2. Influenza vaccines CDC recommends everyone 6 months and older get vaccinated every flu season. Children 6 months through 8 years of age may need 2 doses during a single flu season. Everyone else needs only 1 dose each flu season. It takes about 2 weeks for protection to develop after vaccination. There are many flu viruses, and they are always changing. Each year a new flu vaccine is made to protect against the influenza viruses believed to be likely to cause disease  in the upcoming flu season. Even when the vaccine doesn't exactly match these viruses, it may still provide some protection. Influenza vaccine does not cause flu. Influenza vaccine may be given at the same time as other vaccines. 3. Talk with your health care provider Tell your vaccination provider if the person getting the vaccine: Has had an allergic reaction after a previous dose of influenza vaccine, or has any severe, life-threatening allergies Has ever had Guillain-Barr Syndrome (also called "GBS") In some cases, your health care provider may decide to postpone influenza vaccination until a future visit. Influenza vaccine can be administered at any time during pregnancy. People who are or will be pregnant during influenza season should receive inactivated influenza vaccine. People with minor illnesses, such as a cold, may be vaccinated. People who are moderately or severely ill should usually wait until they recover before getting influenza vaccine. Your health care provider can give you more information. 4. Risks of a vaccine reaction Soreness, redness, and swelling where the shot is given, fever, muscle aches, and headache can happen after influenza vaccination. There may be a very small increased risk of Guillain-Barr Syndrome (GBS) after inactivated influenza vaccine (the flu shot). Young children who get the flu shot along with pneumococcal vaccine (PCV13) and/or DTaP vaccine at the same time might be slightly more likely to have a seizure caused by fever. Tell your health care provider if a child who is getting flu vaccine has ever had a seizure. People sometimes faint after medical procedures, including vaccination. Tell your provider if you feel dizzy or have vision changes or ringing in the ears. As with any medicine, there is a very remote chance of a vaccine causing a severe allergic reaction, other serious injury, or death. 5. What if there is a serious problem? An allergic  reaction could occur after the vaccinated person leaves the clinic. If you see signs of a severe allergic reaction (hives, swelling of the face and throat, difficulty breathing, a fast heartbeat, dizziness, or weakness), call 9-1-1 and get the person to the nearest hospital. For other signs that concern you, call your health care provider. Adverse reactions should be reported to the Vaccine Adverse Event Reporting System (VAERS). Your health care provider will usually file this report, or you can do it yourself. Visit the VAERS website at www.vaers.hhs.gov or call 1-800-822-7967. VAERS is only for reporting reactions, and VAERS staff members do not give medical advice. 6. The National Vaccine Injury Compensation Program The National Vaccine Injury Compensation Program (VICP) is a federal program that was created to compensate people who may have been injured by certain vaccines. Claims regarding alleged injury or death due to vaccination have a time limit for filing, which may be as short as two years. Visit the VICP website at www.hrsa.gov/vaccinecompensation or call 1-800-338-2382 to learn about the program and about filing a claim. 7. How can I learn more? Ask   your health care provider. Call your local or state health department. Visit the website of the Food and Drug Administration (FDA) for vaccine package inserts and additional information at www.fda.gov/vaccines-blood-biologics/vaccines. Contact the Centers for Disease Control and Prevention (CDC): Call 1-800-232-4636 (1-800-CDC-INFO) or Visit CDC's website at www.cdc.gov/flu. Vaccine Information Statement Inactivated Influenza Vaccine (12/12/2019) This information is not intended to replace advice given to you by your health care provider. Make sure you discuss any questions you have with your health care provider. Document Revised: 01/29/2020 Document Reviewed: 01/29/2020 Elsevier Patient Education  2022 Elsevier Inc.  

## 2021-02-15 NOTE — Progress Notes (Signed)
I,Cindy Cain,acting as a Education administrator for [employer], FNP.,have documented all relevant documentation on the behalf of Cindy Brine, FNP,as directed by  Cindy Brine, FNP while in the presence of Cindy Cain, [place].   This visit occurred during the SARS-CoV-2 public health emergency.  Safety protocols were in place, including screening questions prior to the visit, additional usage of staff PPE, and extensive cleaning of exam room while observing appropriate contact time as indicated for disinfecting solutions.  Subjective:     Patient ID: Cindy Cain , female    DOB: 1960-09-09 , 60 y.o.   MRN: [id number]   Chief Complaint  Patient presents with   Annual Exam    HPI  Patient here for hm. She no longer goes to a GYN provider. She is due to see the hematologist next week. She has had an iron transfusion - she can tell a difference with her energy level.   Wt Readings from Last 3 Encounters: 02/15/21 : 202 lb 6.4 oz (91.8 kg) 06/03/20 : 210 lb 3.2 oz (95.3 kg) 03/10/20 : 200 lb 12 oz (91.1 kg)      Past Medical History:  Diagnosis Date   Hypertension      Family History  Problem Relation Age of Onset   COPD Mother    Hemolytic uremic syndrome Father    Heart attack Brother    Diabetes Brother    Hypertension Brother    Allergies Son      Current Outpatient Medications:    acetaminophen (TYLENOL) 325 MG tablet, Take by mouth., Disp: , Rfl:    Blood Glucose Monitoring Suppl (FIFTY50 GLUCOSE METER 2.0) w/Device KIT, DX: Z98.84. E16.1 Use as instructed, Disp: , Rfl:    CALCIUM PO, Take 1 tablet by mouth daily., Disp: , Rfl:    Cholecalciferol (VITAMIN D3) 125 MCG (5000 UT) CAPS, Take by mouth., Disp: , Rfl:    diclofenac sodium (VOLTAREN) 1 % GEL, Apply 2 g topically 4 (four) times daily as needed., Disp: 100 g, Rfl: 1   docusate sodium (COLACE) 100 MG capsule, Take by mouth., Disp: , Rfl:    glucose blood test strip, by Other route two (2) times a day as needed.,  Disp: , Rfl:    lisinopril-hydrochlorothiazide (ZESTORETIC) 10-12.5 MG tablet, TAKE 1 TABLET BY MOUTH DAILY, Disp: 90 tablet, Rfl: 1   loratadine (CLARITIN) 10 MG tablet, Take 1 tablet (10 mg total) by mouth daily., Disp: 30 tablet, Rfl: 2   magnesium oxide (MAG-OX) 400 MG tablet, Take 400 mg by mouth daily., Disp: , Rfl:    meclizine (ANTIVERT) 12.5 MG tablet, Take 1 tablet (12.5 mg total) by mouth 3 (three) times daily as needed for dizziness., Disp: 30 tablet, Rfl: 0   Multiple Vitamins-Minerals (MULTIVITAMIN WITH MINERALS) tablet, Take 1 tablet by mouth daily., Disp: , Rfl:    Thiamine HCl (VITAMIN B-1) 250 MG tablet, Take by mouth., Disp: , Rfl:    Allergies  Allergen Reactions   Aspirin Other (See Comments)    Ulcers    Codeine Other (See Comments)    ulcers      The patient states she is status post hysterectomy.   No LMP recorded. Patient has had a hysterectomy.. Negative for Dysmenorrhea and Negative for Menorrhagia. Negative for: breast discharge, breast lump(s), breast pain and breast self exam. Associated symptoms include abnormal vaginal bleeding. Pertinent negatives include abnormal bleeding (hematology), anxiety, decreased libido, depression, difficulty falling sleep, dyspareunia, history of infertility, nocturia, sexual dysfunction, sleep  disturbances, urinary incontinence, urinary urgency, vaginal discharge and vaginal itching. Diet regular; no fried foods only eats meats on Sunday, no bread, only drink water. She is doing intermittent fasting eats 6 hours of eating. The patient states her exercise level is 5 days a week she is exercising 30 minutes, she is doing 3 strength training weekly.    The patient's tobacco use is:  Social History   Tobacco Use  Smoking Status Never  Smokeless Tobacco Never   She has been exposed to passive smoke. The patient's alcohol use is:  Social History   Substance and Sexual Activity  Alcohol Use No    Review of Systems   Constitutional: Negative.   HENT: Negative.    Eyes: Negative.   Respiratory: Negative.    Cardiovascular: Negative.   Gastrointestinal: Negative.   Endocrine: Negative.   Genitourinary: Negative.   Musculoskeletal: Negative.   Skin: Negative.   Allergic/Immunologic: Negative.   Neurological: Negative.   Hematological: Negative.   Psychiatric/Behavioral: Negative.      Today's Vitals   02/15/21 1158  BP: 134/80  Pulse: (!) 57  Temp: 98.2 F (36.8 C)  Weight: 202 lb 6.4 oz (91.8 kg)  Height: 5' 4"  (1.626 m)  PainSc: 0-No pain   Body mass index is 34.74 kg/m.   Objective:  Physical Exam Constitutional:      General: She is not in acute distress.    Appearance: Normal appearance. She is well-developed. She is obese.  HENT:     Head: Normocephalic and atraumatic.     Right Ear: Hearing, tympanic membrane, ear canal and external ear normal. There is no impacted cerumen.     Left Ear: Hearing, tympanic membrane, ear canal and external ear normal. There is no impacted cerumen.     Nose:     Comments: Deferred - masked    Mouth/Throat:     Comments: Deferred - masked Eyes:     General: Lids are normal.     Extraocular Movements: Extraocular movements intact.     Conjunctiva/sclera: Conjunctivae normal.     Pupils: Pupils are equal, round, and reactive to light.     Funduscopic exam:    Right eye: No papilledema.        Left eye: No papilledema.  Neck:     Thyroid: No thyroid mass.     Vascular: No carotid bruit.  Cardiovascular:     Rate and Rhythm: Normal rate and regular rhythm.     Pulses: Normal pulses.     Heart sounds: Normal heart sounds. No murmur heard. Pulmonary:     Effort: Pulmonary effort is normal. No respiratory distress.     Breath sounds: Normal breath sounds. No wheezing.  Chest:     Chest wall: No mass.  Breasts:    Tanner Score is 5.     Right: Normal. No mass or tenderness.     Left: Normal. No mass or tenderness.  Abdominal:      General: Abdomen is flat. Bowel sounds are normal. There is no distension.     Palpations: Abdomen is soft.     Tenderness: There is no abdominal tenderness.  Musculoskeletal:        General: No swelling. Normal range of motion.     Cervical back: Full passive range of motion without pain, normal range of motion and neck supple.     Right lower leg: No edema.     Left lower leg: No edema.  Lymphadenopathy:  Upper Body:     Right upper body: No supraclavicular, axillary or pectoral adenopathy.     Left upper body: No supraclavicular, axillary or pectoral adenopathy.  Skin:    General: Skin is warm and dry.     Capillary Refill: Capillary refill takes less than 2 seconds.  Neurological:     General: No focal deficit present.     Mental Status: She is alert and oriented to person, place, and time.     Cranial Nerves: No cranial nerve deficit.     Sensory: No sensory deficit.     Motor: No weakness.  Psychiatric:        Mood and Affect: Mood normal.        Behavior: Behavior normal.        Thought Content: Thought content normal.        Judgment: Judgment normal.        Assessment And Plan:     1. Encounter for general adult medical examination w/o abnormal findings Behavior modifications discussed and diet history reviewed.   Pt will continue to exercise regularly and modify diet with low GI, plant based foods and decrease intake of processed foods.  Recommend intake of daily multivitamin, Vitamin D, and calcium.  Recommend mammogram and colonoscopy for preventive screenings, as well as recommend immunizations that include influenza, TDAP, and Shingles (declined)  2. Essential hypertension Comments: Blood pressure is under good control, continue current medications Will check eGFR EKG done with HR 65 NSR - POCT Urinalysis Dipstick (81002) - POCT UA - Microalbumin - EKG 12-Lead  3. Mixed hyperlipidemia Comments: Good control, no current medications - Lipid panel  4.  Vaginal dryness, menopausal Comments: She is to take vitamin e suppositories 1 vaginally x 7 days then once every 3 days as needed. Will call [employer]  5. Class 1 obesity due to excess calories with body mass index (BMI) of 34.0 to 34.9 in adult, unspecified whether serious comorbidity present Chronic Discussed healthy diet and regular exercise options  Encouraged to exercise at least 150 minutes per week with 2 days of strength training, I have encouraged her to change up her exercise routine to help with her plateau  - Hemoglobin A1c  6. Immunization due Influenza vaccine administered Encouraged to take Tylenol as needed for fever or muscle aches. - Flu Vaccine QUAD 6+ mos PF IM (Fluarix Quad PF)  She will have her labs done at her Hematologist, forms/orders given.  Patient was given opportunity to ask questions. Patient verbalized understanding of the plan and was able to repeat key elements of the plan. All questions were answered to their satisfaction.   Cindy Brine, FNP   I, Cindy Brine, FNP, have reviewed all documentation for this visit. The documentation on 02/15/21 for the exam, diagnosis, procedures, and orders are all accurate and complete.   THE PATIENT IS ENCOURAGED TO PRACTICE SOCIAL DISTANCING DUE TO THE COVID-19 PANDEMIC.

## 2021-03-21 ENCOUNTER — Other Ambulatory Visit: Payer: Self-pay | Admitting: Nurse Practitioner

## 2021-03-21 DIAGNOSIS — I10 Essential (primary) hypertension: Secondary | ICD-10-CM

## 2021-04-12 ENCOUNTER — Telehealth: Payer: Self-pay | Admitting: Hematology

## 2021-04-12 NOTE — Telephone Encounter (Signed)
Scheduled per sch msg. Called and spoke with patient. Confirmdd appt

## 2021-04-13 ENCOUNTER — Ambulatory Visit: Payer: Self-pay | Admitting: Hematology

## 2021-04-13 ENCOUNTER — Other Ambulatory Visit: Payer: Self-pay

## 2021-05-11 ENCOUNTER — Telehealth: Payer: Self-pay | Admitting: Hematology

## 2021-05-11 ENCOUNTER — Other Ambulatory Visit: Payer: Self-pay

## 2021-05-11 DIAGNOSIS — D509 Iron deficiency anemia, unspecified: Secondary | ICD-10-CM

## 2021-05-11 NOTE — Telephone Encounter (Signed)
Called patient regarding upcoming 01/17 appointment scheduled per 01/04 scheduled message, patient is notified.

## 2021-05-12 ENCOUNTER — Other Ambulatory Visit: Payer: Self-pay

## 2021-05-12 ENCOUNTER — Ambulatory Visit: Payer: Self-pay | Admitting: Hematology

## 2021-05-24 ENCOUNTER — Encounter: Payer: Self-pay | Admitting: Hematology

## 2021-05-24 ENCOUNTER — Inpatient Hospital Stay (HOSPITAL_BASED_OUTPATIENT_CLINIC_OR_DEPARTMENT_OTHER): Admitting: Hematology

## 2021-05-24 ENCOUNTER — Inpatient Hospital Stay: Attending: Hematology

## 2021-05-24 ENCOUNTER — Other Ambulatory Visit: Payer: Self-pay

## 2021-05-24 VITALS — BP 125/79 | HR 51 | Temp 97.2°F | Resp 17 | Wt 205.7 lb

## 2021-05-24 DIAGNOSIS — Z9884 Bariatric surgery status: Secondary | ICD-10-CM | POA: Diagnosis not present

## 2021-05-24 DIAGNOSIS — R0609 Other forms of dyspnea: Secondary | ICD-10-CM | POA: Diagnosis not present

## 2021-05-24 DIAGNOSIS — D509 Iron deficiency anemia, unspecified: Secondary | ICD-10-CM | POA: Diagnosis not present

## 2021-05-24 DIAGNOSIS — R5383 Other fatigue: Secondary | ICD-10-CM | POA: Insufficient documentation

## 2021-05-24 DIAGNOSIS — E538 Deficiency of other specified B group vitamins: Secondary | ICD-10-CM

## 2021-05-24 LAB — CBC WITH DIFFERENTIAL (CANCER CENTER ONLY)
Abs Immature Granulocytes: 0 10*3/uL (ref 0.00–0.07)
Basophils Absolute: 0 10*3/uL (ref 0.0–0.1)
Basophils Relative: 1 %
Eosinophils Absolute: 0.2 10*3/uL (ref 0.0–0.5)
Eosinophils Relative: 4 %
HCT: 39.3 % (ref 36.0–46.0)
Hemoglobin: 12.6 g/dL (ref 12.0–15.0)
Immature Granulocytes: 0 %
Lymphocytes Relative: 48 %
Lymphs Abs: 2.2 10*3/uL (ref 0.7–4.0)
MCH: 28.3 pg (ref 26.0–34.0)
MCHC: 32.1 g/dL (ref 30.0–36.0)
MCV: 88.3 fL (ref 80.0–100.0)
Monocytes Absolute: 0.4 10*3/uL (ref 0.1–1.0)
Monocytes Relative: 8 %
Neutro Abs: 1.8 10*3/uL (ref 1.7–7.7)
Neutrophils Relative %: 39 %
Platelet Count: 225 10*3/uL (ref 150–400)
RBC: 4.45 MIL/uL (ref 3.87–5.11)
RDW: 12.5 % (ref 11.5–15.5)
WBC Count: 4.6 10*3/uL (ref 4.0–10.5)
nRBC: 0 % (ref 0.0–0.2)

## 2021-05-24 LAB — CMP (CANCER CENTER ONLY)
ALT: 16 U/L (ref 0–44)
AST: 18 U/L (ref 15–41)
Albumin: 4 g/dL (ref 3.5–5.0)
Alkaline Phosphatase: 71 U/L (ref 38–126)
Anion gap: 6 (ref 5–15)
BUN: 22 mg/dL — ABNORMAL HIGH (ref 6–20)
CO2: 30 mmol/L (ref 22–32)
Calcium: 9.4 mg/dL (ref 8.9–10.3)
Chloride: 104 mmol/L (ref 98–111)
Creatinine: 0.96 mg/dL (ref 0.44–1.00)
GFR, Estimated: >60 mL/min (ref >60)
Glucose, Bld: 83 mg/dL (ref 70–99)
Potassium: 3.9 mmol/L (ref 3.5–5.1)
Sodium: 140 mmol/L (ref 135–145)
Total Bilirubin: 0.4 mg/dL (ref 0.3–1.2)
Total Protein: 7.3 g/dL (ref 6.5–8.1)

## 2021-05-24 LAB — IRON AND IRON BINDING CAPACITY (CC-WL,HP ONLY)
Iron: 133 ug/dL (ref 28–170)
Saturation Ratios: 42 % — ABNORMAL HIGH (ref 10.4–31.8)
TIBC: 316 ug/dL (ref 250–450)
UIBC: 183 ug/dL (ref 148–442)

## 2021-05-24 LAB — VITAMIN B12: Vitamin B-12: 444 pg/mL (ref 180–914)

## 2021-05-24 LAB — FERRITIN: Ferritin: 121 ng/mL (ref 11–307)

## 2021-05-30 ENCOUNTER — Encounter: Payer: Self-pay | Admitting: Hematology

## 2021-05-30 NOTE — Progress Notes (Addendum)
HEMATOLOGY/ONCOLOGY CLINIC NOTE  Date of Service: .05/24/2021   Patient Care Team: [name], FNP as PCP - General (General Practice)  GI- Mickel Duhamel CHIEF COMPLAINTS/PURPOSE OF CONSULTATION:  Follow-up for management of anemia  HISTORY OF PRESENTING ILLNESS:   Cindy Cain is a wonderful 61 y.o. female who has been referred to [place] by [name], NP for evaluation and management of anemia.  The pt reports she has been iron deficient for a long time. She notes that she has been on po  Iron for>10 yrs- multivitamin with iron, iron 32m+23 mg, constipation. -heavy periods. Hysterectomy 2005. 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 No overt GI bleeding. No menstrual losses.  Interval History   Cindy SAMONSis a 61y.o. 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 She notes no acute new symptoms related to anemia or iron deficiency at this time. Overt GI bleeding or other source of blood loss noted. Has been eating a balanced diet.  Labs today CBC shows normal hemoglobin of 12.6 with normal WBC count of 4.6k and platelets of 225k B12 444 Ferritin 121 with an iron saturation of 42% CMP within normal limits.  MEDICAL HISTORY:  Past Medical History:  Diagnosis Date   Hypertension     SURGICAL HISTORY: Past Surgical History:  Procedure Laterality Date   ABDOMINAL HYSTERECTOMY     partial, secondary to bleeding and anemia   APPENDECTOMY     CHOLECYSTECTOMY     GASTRIC BYPASS      SOCIAL  HISTORY: Social History   Socioeconomic History   Marital status: Married    Spouse name: Not on file   Number of children: Not on file   Years of education: Not on file   Highest education level: Not on file  Occupational History   Not on file  Tobacco Use   Smoking status: Never   Smokeless tobacco: Never  Vaping Use   Vaping Use: Never used  Substance and Sexual Activity   Alcohol use: No   Drug use: No   Sexual activity: Not on file  Other Topics Concern   Not on file  Social History Narrative   Not on file   Social Determinants of Health   Financial Resource Strain: Not on file  Food Insecurity: Not on file  Transportation Needs: Not on file  Physical Activity: Not on file  Stress: Not on file  Social Connections: Not on file  Intimate Partner Violence: Not on file    FAMILY HISTORY: Family History  Problem Relation Age of Onset   COPD Mother    Hemolytic uremic syndrome Father    Heart attack Brother    Diabetes Brother    Hypertension Brother    Allergies Son   maternal uncle - "leukemia" - ?ALL at age 4458yrs  ALLERGIES:  is allergic to aspirin and codeine.  MEDICATIONS:  Current Outpatient Medications  Medication Sig Dispense Refill   lisinopril-hydrochlorothiazide (  ZESTORETIC) 10-12.5 MG tablet TAKE 1 TABLET BY MOUTH DAILY 90 tablet 1   loratadine (CLARITIN) 10 MG tablet Take 1 tablet (10 mg total) by mouth daily. 30 tablet 2   magnesium oxide (MAG-OX) 400 MG tablet Take 400 mg by mouth daily.     Multiple Vitamins-Minerals (MULTIVITAMIN WITH MINERALS) tablet Take 1 tablet by mouth daily.     Thiamine HCl (VITAMIN B-1) 250 MG tablet Take by mouth.     acetaminophen (TYLENOL) 325 MG tablet Take by mouth.     Blood Glucose Monitoring Suppl (FIFTY50 GLUCOSE METER 2.0) w/Device KIT DX: Z98.84. E16.1 Use as instructed     CALCIUM PO Take 1 tablet by mouth daily.     Cholecalciferol (VITAMIN D3) 125 MCG (5000 UT) CAPS Take by mouth.     diclofenac  sodium (VOLTAREN) 1 % GEL Apply 2 g topically 4 (four) times daily as needed. 100 g 1   docusate sodium (COLACE) 100 MG capsule Take by mouth.     glucose blood test strip by Other route two (2) times a day as needed.     meclizine (ANTIVERT) 12.5 MG tablet Take 1 tablet (12.5 mg total) by mouth 3 (three) times daily as needed for dizziness. 30 tablet 0   No current facility-administered medications for this visit.    REVIEW OF SYSTEMS:   .10 Point review of Systems was done is negative except as noted above.    PHYSICAL EXAMINATION: ECOG PERFORMANCE STATUS: 1 - Symptomatic but completely ambulatory  . Vitals:   05/24/21 1048  BP: 125/79  Pulse: (!) 51  Resp: 17  Temp: (!) 97.2 F (36.2 C)  SpO2: 99%   Filed Weights   05/24/21 1048  Weight: 205 lb 11.2 oz (93.3 kg)   .Body mass index is 35.31 kg/m. Marland Kitchen GENERAL:alert, in no acute distress and comfortable SKIN: no acute rashes, no significant lesions EYES: conjunctiva are pink and non-injected, sclera anicteric OROPHARYNX: MMM, no exudates, no oropharyngeal erythema or ulceration NECK: supple, no JVD LYMPH:  no palpable lymphadenopathy in the cervical, axillary or inguinal regions LUNGS: clear to auscultation b/l with normal respiratory effort HEART: regular rate & rhythm ABDOMEN:  normoactive bowel sounds , non tender, not distended. Extremity: no pedal edema PSYCH: alert & oriented x 3 with fluent speech NEURO: no focal motor/sensory deficits   LABORATORY DATA:  I have reviewed the data as listed  . CBC Latest Ref Rng & Units 05/24/2021 06/03/2020 03/03/2020  WBC 4.0 - 10.5 K/uL 4.6 4.0 4.5  Hemoglobin 12.0 - 15.0 g/dL 12.6 13.3 9.0(L)  Hematocrit 36.0 - 46.0 % 39.3 42.3 31.3(L)  Platelets 150 - 400 K/uL 225 233 358    . CMP Latest Ref Rng & Units 05/24/2021 03/03/2020 01/21/2020  Glucose 70 - 99 mg/dL 83 91 87  BUN 6 - 20 mg/dL 22(H) 7 9  Creatinine 0.44 - 1.00 mg/dL 0.96 0.94 0.97  Sodium 135 - 145 mmol/L  140 140 137  Potassium 3.5 - 5.1 mmol/L 3.9 3.8 4.1  Chloride 98 - 111 mmol/L 104 106 99  CO2 22 - 32 mmol/L _0 Calcium 8.9 - 10.3 mg/dL 9.4 9.7 9.3  Total Protein 6.5 - 8.1 g/dL 7.3 7.2 7.2  Total Bilirubin 0.3 - 1.2 mg/dL 0.4 0.3 0.2  Alkaline Phos 38 - 126 U/L 71 92 91  AST 15 - 41 U/L 18 40 18  ALT 0 - 44 U/L 16 37 11   . Lab Results  Component Value Date   IRON 133 05/24/2021   TIBC 316 05/24/2021   IRONPCTSAT 42 (H) 05/24/2021   (Iron and TIBC)  Lab Results  Component Value Date   FERRITIN 121 05/24/2021   B12 - 444  RADIOGRAPHIC STUDIES: I have personally reviewed the radiological images as listed and agreed with the findings in the report. No results found.  ASSESSMENT & PLAN:   61 yo with   1. 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 Cannot r/o GI losses  PLAN: -Lab results from 05/24/2021 were discussed in detail with the patient.  Her CBC is within normal limits with a hemoglobin of 12.6 with normal WBC count and platelets B12 within normal limits at 444 Ferritin of 121 and iron saturation of 42%. CMP within normal limits.  Patient's iron levels are at goal and there is no indication for additional IV iron at this time.  The goal is to keep her ferritin close to or greater than 100. She can continue iron polysaccharide 150 mg p.o. daily for maintenance. Would recommend following up with her primary care physician at least every 6 months to monitor her CBC ferritin and iron profile and reconsulting [place] if the ferritin is less than 50 or she starts developing significant anemia.  Continue sublingual B12 1000 mcg at least 4 days a week  FOLLOW UP: RTC with Dr Irene Limbo with labs in 12 months  All of the patients questions were answered with apparent satisfaction. The patient knows to call the clinic with any problems, questions or concerns.    Sullivan Lone MD [place] AAHIVMS [facility] Hematology/Oncology Physician [facility]

## 2021-08-15 ENCOUNTER — Other Ambulatory Visit: Payer: Self-pay | Admitting: Nurse Practitioner

## 2021-08-15 DIAGNOSIS — I10 Essential (primary) hypertension: Secondary | ICD-10-CM

## 2021-08-24 ENCOUNTER — Ambulatory Visit (INDEPENDENT_AMBULATORY_CARE_PROVIDER_SITE_OTHER): Admitting: Nurse Practitioner

## 2021-08-24 ENCOUNTER — Encounter: Payer: Self-pay | Admitting: Nurse Practitioner

## 2021-08-24 VITALS — BP 124/80 | HR 58 | Temp 98.4°F | Ht 64.0 in | Wt 197.0 lb

## 2021-08-24 DIAGNOSIS — R0981 Nasal congestion: Secondary | ICD-10-CM | POA: Diagnosis not present

## 2021-08-24 DIAGNOSIS — Z23 Encounter for immunization: Secondary | ICD-10-CM | POA: Diagnosis not present

## 2021-08-24 DIAGNOSIS — I10 Essential (primary) hypertension: Secondary | ICD-10-CM | POA: Diagnosis not present

## 2021-08-24 DIAGNOSIS — E6609 Other obesity due to excess calories: Secondary | ICD-10-CM

## 2021-08-24 DIAGNOSIS — Z6833 Body mass index (BMI) 33.0-33.9, adult: Secondary | ICD-10-CM

## 2021-08-24 MED ORDER — LISINOPRIL-HYDROCHLOROTHIAZIDE 10-12.5 MG PO TABS: 1.0000 | ORAL_TABLET | Freq: Every day | ORAL | 1 refills | Status: DC

## 2021-08-24 MED ORDER — LORATADINE 10 MG PO TABS: 10.0000 mg | ORAL_TABLET | Freq: Every day | ORAL | 1 refills | Status: DC

## 2021-08-24 NOTE — Patient Instructions (Signed)
Hypertension, Adult ?Hypertension is another name for high blood pressure. High blood pressure forces your heart to work harder to pump blood. This can cause problems over time. ?There are two numbers in a blood pressure reading. There is a top number (systolic) over a bottom number (diastolic). It is best to have a blood pressure that is below 120/80. ?What are the causes? ?The cause of this condition is not known. Some other conditions can lead to high blood pressure. ?What increases the risk? ?Some lifestyle factors can make you more likely to develop high blood pressure: ?Smoking. ?Not getting enough exercise or physical activity. ?Being overweight. ?Having too much fat, sugar, calories, or salt (sodium) in your diet. ?Drinking too much alcohol. ?Other risk factors include: ?Having any of these conditions: ?Heart disease. ?Diabetes. ?High cholesterol. ?Kidney disease. ?Obstructive sleep apnea. ?Having a family history of high blood pressure and high cholesterol. ?Age. The risk increases with age. ?Stress. ?What are the signs or symptoms? ?High blood pressure may not cause symptoms. Very high blood pressure (hypertensive crisis) may cause: ?Headache. ?Fast or uneven heartbeats (palpitations). ?Shortness of breath. ?Nosebleed. ?Vomiting or feeling like you may vomit (nauseous). ?Changes in how you see. ?Very bad chest pain. ?Feeling dizzy. ?Seizures. ?How is this treated? ?This condition is treated by making healthy lifestyle changes, such as: ?Eating healthy foods. ?Exercising more. ?Drinking less alcohol. ?Your doctor may prescribe medicine if lifestyle changes do not help enough and if: ?Your top number is above 130. ?Your bottom number is above 80. ?Your personal target blood pressure may vary. ?Follow these instructions at home: ?Eating and drinking ? ?If told, follow the DASH eating plan. To follow this plan: ?Fill one half of your plate at each meal with fruits and vegetables. ?Fill one fourth of your plate  at each meal with whole grains. Whole grains include whole-wheat pasta, brown rice, and whole-grain bread. ?Eat or drink low-fat dairy products, such as skim milk or low-fat yogurt. ?Fill one fourth of your plate at each meal with low-fat (lean) proteins. Low-fat proteins include fish, chicken without skin, eggs, beans, and tofu. ?Avoid fatty meat, cured and processed meat, or chicken with skin. ?Avoid pre-made or processed food. ?Limit the amount of salt in your diet to less than 1,500 mg each day. ?Do not drink alcohol if: ?Your doctor tells you not to drink. ?You are pregnant, may be pregnant, or are planning to become pregnant. ?If you drink alcohol: ?Limit how much you have to: ?0-1 drink a day for women. ?0-2 drinks a day for men. ?Know how much alcohol is in your drink. In the [place] one drink equals one 12 oz bottle of beer (355 mL), one 5 oz glass of wine (148 mL), or one 1? oz glass of hard liquor (44 mL). ?Lifestyle ? ?Work with your doctor to stay at a healthy weight or to lose weight. Ask your doctor what the best weight is for you. ?Get at least 30 minutes of exercise that causes your heart to beat faster (aerobic exercise) most days of the week. This may include walking, swimming, or biking. ?Get at least 30 minutes of exercise that strengthens your muscles (resistance exercise) at least 3 days a week. This may include lifting weights or doing Pilates. ?Do not smoke or use any products that contain nicotine or tobacco. If you need help quitting, ask your doctor. ?Check your blood pressure at home as told by your doctor. ?Keep all follow-up visits. ?Medicines ?Take over-the-counter and prescription medicines   only as told by your doctor. Follow directions carefully. ?Do not skip doses of blood pressure medicine. The medicine does not work as well if you skip doses. Skipping doses also puts you at risk for problems. ?Ask your doctor about side effects or reactions to medicines that you should watch  for. ?Contact a doctor if: ?You think you are having a reaction to the medicine you are taking. ?You have headaches that keep coming back. ?You feel dizzy. ?You have swelling in your ankles. ?You have trouble with your vision. ?Get help right away if: ?You get a very bad headache. ?You start to feel mixed up (confused). ?You feel weak or numb. ?You feel faint. ?You have very bad pain in your: ?Chest. ?Belly (abdomen). ?You vomit more than once. ?You have trouble breathing. ?These symptoms may be an emergency. Get help right away. Call 911. ?Do not wait to see if the symptoms will go away. ?Do not drive yourself to the hospital. ?Summary ?Hypertension is another name for high blood pressure. ?High blood pressure forces your heart to work harder to pump blood. ?For most people, a normal blood pressure is less than 120/80. ?Making healthy choices can help lower blood pressure. If your blood pressure does not get lower with healthy choices, you may need to take medicine. ?This information is not intended to replace advice given to you by your health care provider. Make sure you discuss any questions you have with your health care provider. ?Document Revised: 02/10/2021 Document Reviewed: 02/10/2021 ?Elsevier Patient Education ? 2023 Elsevier Inc. ? ?

## 2021-08-24 NOTE — Progress Notes (Signed)
?I,[name],acting as a Education administrator for [employer], FNP.,have documented all relevant documentation on the behalf of [name], FNP,as directed by  [name], FNP while in the presence of [name], [place].  ? ?This visit occurred during the SARS-CoV-2 public health emergency.  Safety protocols were in place, including screening questions prior to the visit, additional usage of staff PPE, and extensive cleaning of exam room while observing appropriate contact time as indicated for disinfecting solutions. ? ?Subjective:  ?  ? Patient ID: Cindy Cain , female    DOB: [birth year] , 61 y.o.   MRN: [id number] ? ? ?Chief Complaint  ?Patient presents with  ? Hypertension  ? ? ?HPI ? ?Patient presents today for a bp check. Patient reports compliance with her medications. Patient does not have any questions or concerns at this time.  She has had 2 iron transfusions and feels better, does not have to follow up for one year  ? ?Wt Readings from Last 3 Encounters: ?08/24/21 : 197 lb (89.4 kg) ?05/24/21 : 205 lb 11.2 oz (93.3 kg) ?02/15/21 : 202 lb 6.4 oz (91.8 kg) ?  ? ?Hypertension ?This is a chronic problem. The current episode started more than 1 year ago. The problem is unchanged. Pertinent negatives include no chest pain, headaches, palpitations or shortness of breath. Risk factors for coronary artery disease include sedentary lifestyle. Past treatments include ACE inhibitors and diuretics. Compliance problems include exercise.  There is no history of chronic renal disease.   ? ?Past Medical History:  ?Diagnosis Date  ? Hypertension   ?  ? ?Family History  ?Problem Relation Age of Onset  ? COPD Mother   ? Hemolytic uremic syndrome Father   ? Heart attack Brother   ? Diabetes Brother   ? Hypertension Brother   ? Allergies Son   ? ? ? ?Current Outpatient Medications:  ?  acetaminophen (TYLENOL) 325 MG tablet, Take by mouth., Disp: , Rfl:  ?  Blood Glucose Monitoring Suppl (FIFTY50 GLUCOSE METER 2.0) w/Device  KIT, DX: Z98.84. E16.1 Use as instructed, Disp: , Rfl:  ?  CALCIUM PO, Take 1 tablet by mouth daily., Disp: , Rfl:  ?  Cholecalciferol (VITAMIN D3) 125 MCG (5000 UT) CAPS, Take by mouth., Disp: , Rfl:  ?  diclofenac sodium (VOLTAREN) 1 % GEL, Apply 2 g topically 4 (four) times daily as needed., Disp: 100 g, Rfl: 1 ?  docusate sodium (COLACE) 100 MG capsule, Take by mouth., Disp: , Rfl:  ?  glucose blood test strip, by Other route two (2) times a day as needed., Disp: , Rfl:  ?  magnesium oxide (MAG-OX) 400 MG tablet, Take 400 mg by mouth daily., Disp: , Rfl:  ?  meclizine (ANTIVERT) 12.5 MG tablet, Take 1 tablet (12.5 mg total) by mouth 3 (three) times daily as needed for dizziness., Disp: 30 tablet, Rfl: 0 ?  Multiple Vitamins-Minerals (MULTIVITAMIN WITH MINERALS) tablet, Take 1 tablet by mouth daily., Disp: , Rfl:  ?  Thiamine HCl (VITAMIN B-1) 250 MG tablet, Take by mouth., Disp: , Rfl:  ?  lisinopril-hydrochlorothiazide (ZESTORETIC) 10-12.5 MG tablet, Take 1 tablet by mouth daily., Disp: 90 tablet, Rfl: 1 ?  loratadine (CLARITIN) 10 MG tablet, Take 1 tablet (10 mg total) by mouth daily., Disp: 90 tablet, Rfl: 1  ? ?Allergies  ?Allergen Reactions  ? Aspirin Other (See Comments)  ?  Ulcers   ? Codeine Other (See Comments)  ?  ulcers  ?  ? ?Review  of Systems  ?Constitutional: Negative.  Negative for activity change and fatigue.  ?Eyes:  Negative for visual disturbance.  ?Respiratory: Negative.  Negative for choking, shortness of breath and wheezing.   ?Cardiovascular: Negative.  Negative for chest pain, palpitations and leg swelling.  ?Gastrointestinal: Negative.   ?Endocrine: Negative.  Negative for polydipsia, polyphagia and polyuria.  ?Musculoskeletal: Negative.   ?Skin: Negative.   ?Neurological:  Negative for dizziness, weakness and headaches.  ?Psychiatric/Behavioral:  Negative for confusion. The patient is not nervous/anxious.    ? ?Today's Vitals  ? 08/24/21 1038  ?BP: 124/80  ?Pulse: (!) 58  ?Temp: 98.4 ?F  (36.9 ?C)  ?Weight: 197 lb (89.4 kg)  ?Height: _0  (1.626 m)  ?PainSc: 0-No pain  ? ?Body mass index is 33.81 kg/m?.  ? ?Objective:  ?Physical Exam ?Vitals reviewed.  ?Constitutional:   ?   General: She is not in acute distress. ?   Appearance: Normal appearance. She is well-developed. She is obese.  ?HENT:  ?   Head: Normocephalic and atraumatic.  ?Eyes:  ?   Pupils: Pupils are equal, round, and reactive to light.  ?Cardiovascular:  ?   Rate and Rhythm: Normal rate and regular rhythm.  ?   Pulses: Normal pulses.  ?   Heart sounds: Normal heart sounds. No murmur heard. ?Pulmonary:  ?   Effort: Pulmonary effort is normal. No respiratory distress.  ?   Breath sounds: Normal breath sounds. No wheezing.  ?Skin: ?   General: Skin is warm and dry.  ?   Capillary Refill: Capillary refill takes less than 2 seconds.  ?Neurological:  ?   General: No focal deficit present.  ?   Mental Status: She is alert and oriented to person, place, and time.  ?   Cranial Nerves: No cranial nerve deficit.  ?   Motor: No weakness.  ?Psychiatric:     ?   Mood and Affect: Mood normal.     ?   Behavior: Behavior normal.     ?   Judgment: Judgment normal.  ?  ? ?   ?Assessment And Plan:  ?   ?1. Essential hypertension ?Comments: Blood pressure is fairly controlled, continue current medications. ?- lisinopril-hydrochlorothiazide (ZESTORETIC) 10-12.5 MG tablet; Take 1 tablet by mouth daily.  Dispense: 90 tablet; Refill: 1 ? ?2. Immunization due ?Shingrix # 1 given in office, return in 2-6 months for 2nd dose ?- Varicella-zoster vaccine IM (Shingrix) ? ?3. Nasal congestion ?- loratadine (CLARITIN) 10 MG tablet; Take 1 tablet (10 mg total) by mouth daily.  Dispense: 90 tablet; Refill: 1 ? ?4. Class 1 obesity due to excess calories with body mass index (BMI) of 33.0 to 33.9 in adult, unspecified whether serious comorbidity present ?She is encouraged to strive for BMI less than 30 to decrease cardiac risk. Advised to aim for at least 150 minutes of  exercise per week. ? ? ?Patient was given opportunity to ask questions. Patient verbalized understanding of the plan and was able to repeat key elements of the plan. All questions were answered to their satisfaction.  ?[name], FNP  ? ?I, [name], FNP, have reviewed all documentation for this visit. The documentation on 08/24/21 for the exam, diagnosis, procedures, and orders are all accurate and complete.  ? ?IF YOU HAVE BEEN REFERRED TO A SPECIALIST, IT MAY TAKE 1-2 WEEKS TO SCHEDULE/PROCESS THE REFERRAL. IF YOU HAVE NOT HEARD FROM US/SPECIALIST IN TWO WEEKS, PLEASE GIVE [place] A CALL AT [phone] X [phone].  ? ?  THE PATIENT IS ENCOURAGED TO PRACTICE SOCIAL DISTANCING DUE TO THE COVID-19 PANDEMIC.   ?

## 2021-09-07 IMAGING — CT CT HEAD W/O CM
4 series · 17 of 47 positions shown, 19 images · non-contrast
Comparison: None.

CLINICAL DATA: Dizziness, weakness

EXAM:
CT HEAD WITHOUT CONTRAST
TECHNIQUE: Contiguous axial images were obtained from the base of the skull
through the vertex without intravenous contrast.

[Series 3: head wo · axial · 0.43mm/px · z∈[+956,+1076]mm · 7 of 32 slices shown, 9 images]
[im 4/32  brain]
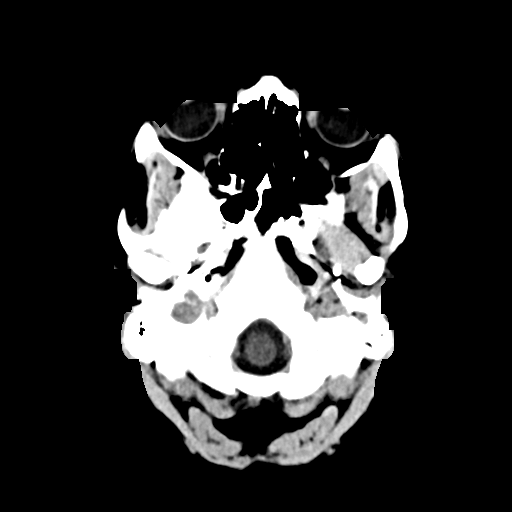
[im 4/32  bone]
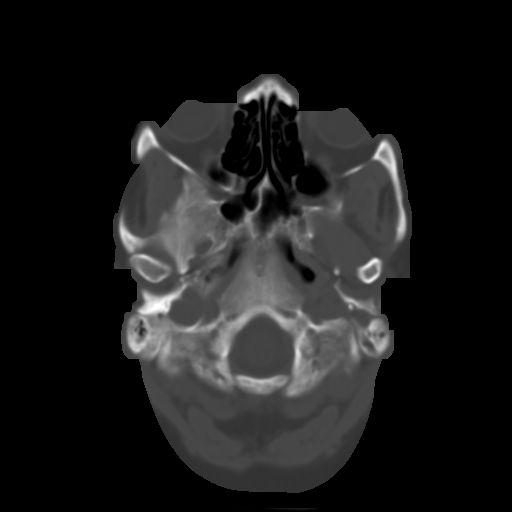
[im 8/32  brain]
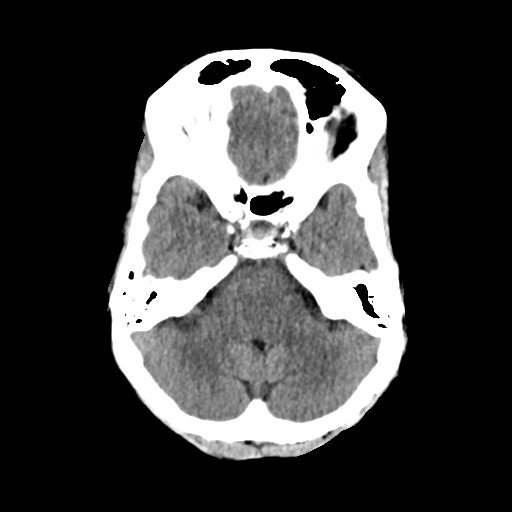
[im 12/32  brain]
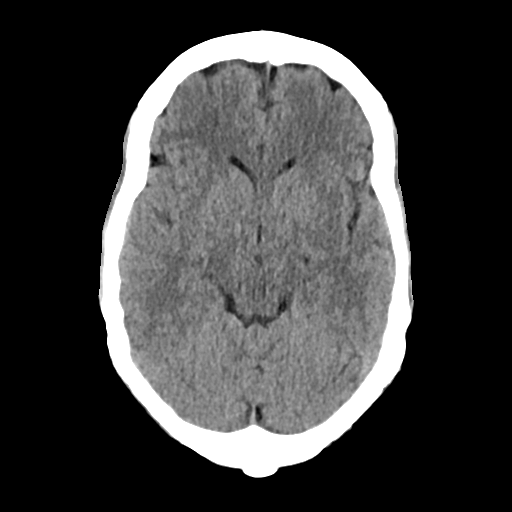
[im 16/32  brain]
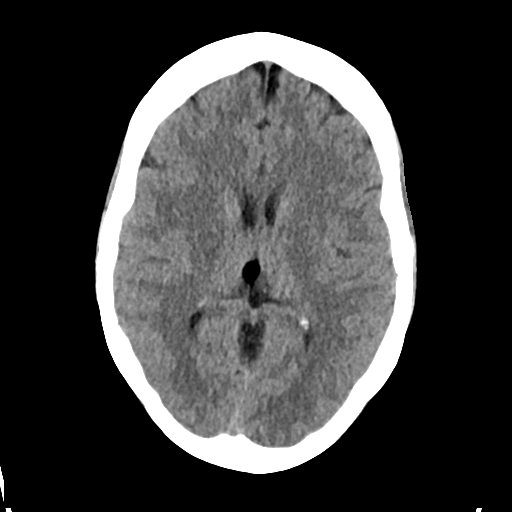
[im 20/32  brain]
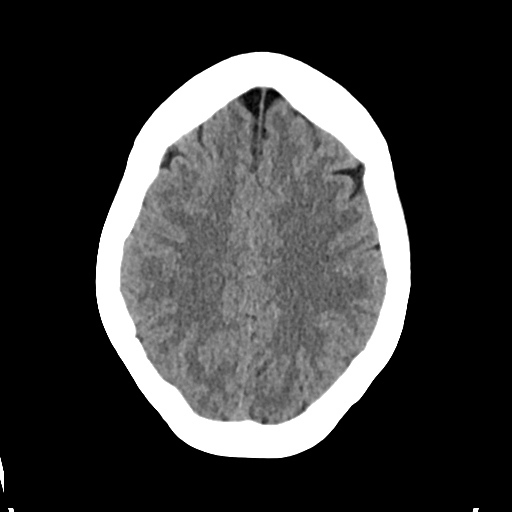
[im 20/32  bone]
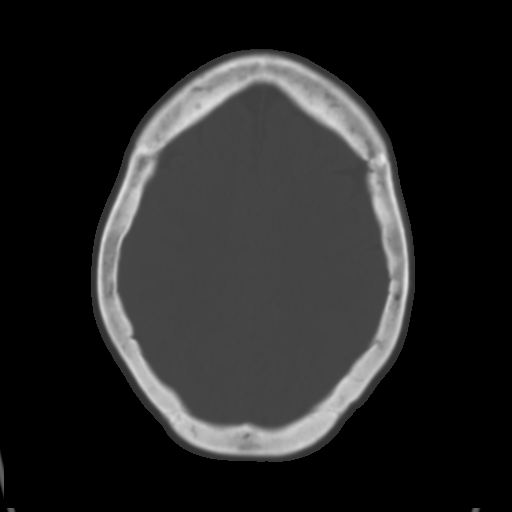
[im 24/32  brain]
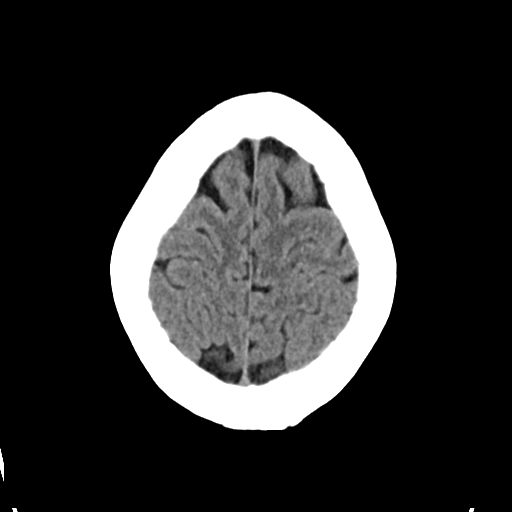
[im 28/32  brain]
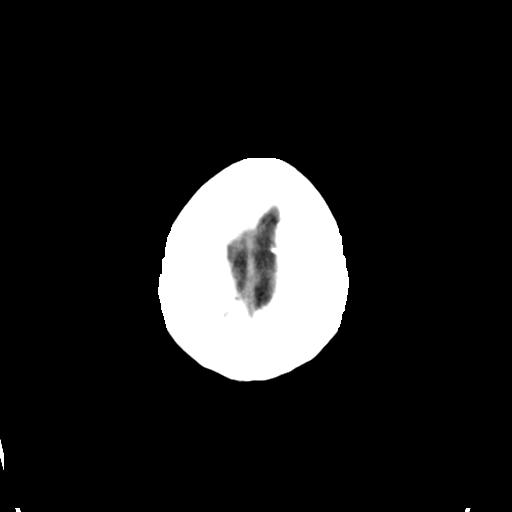

[Series 4: head bone · axial · 0.43mm/px · z∈[+956,+1012]mm · 4 of 80 slices shown]
[im 8/80  bone]
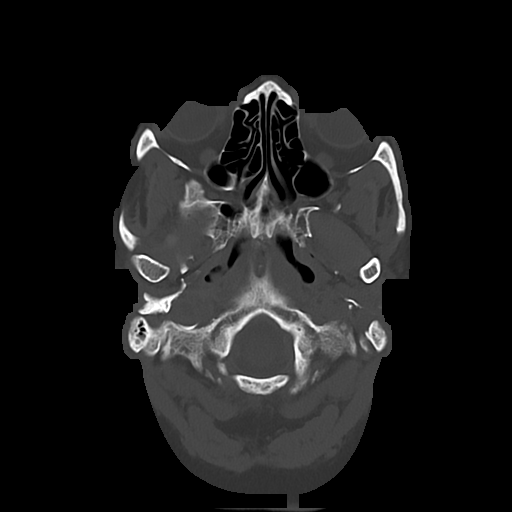
[im 16/80  bone]
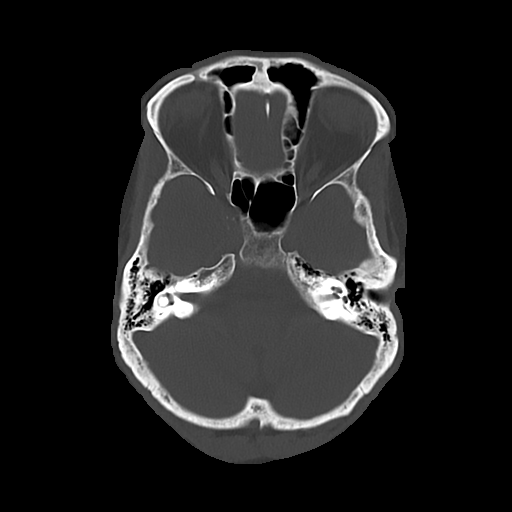
[im 24/80  bone]
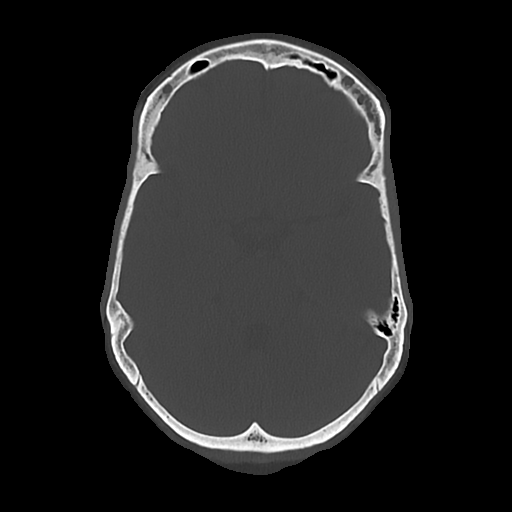
[im 36/80  bone]
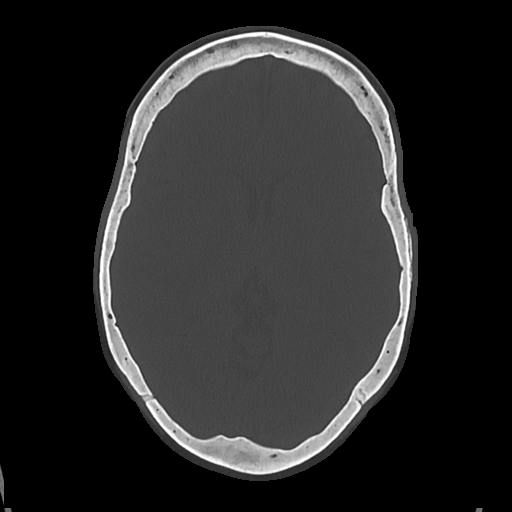

[Series 5: cor soft · coronal · 0.31mm/px · 3 of 70 slices shown]
[im 24/70  brain]
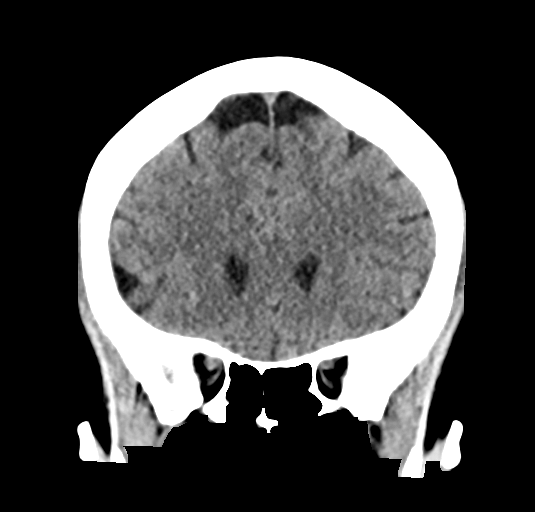
[im 31/70  brain]
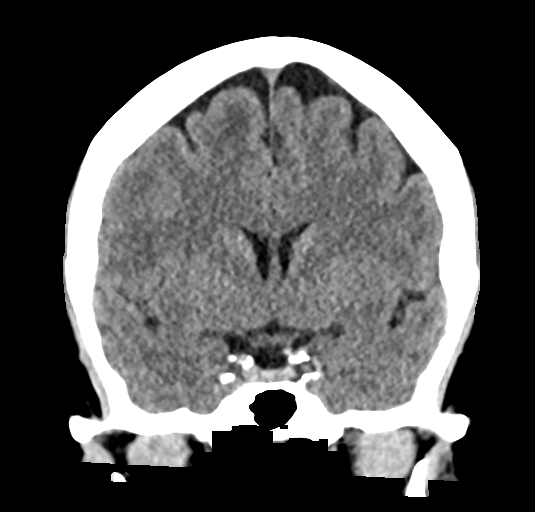
[im 39/70  brain]
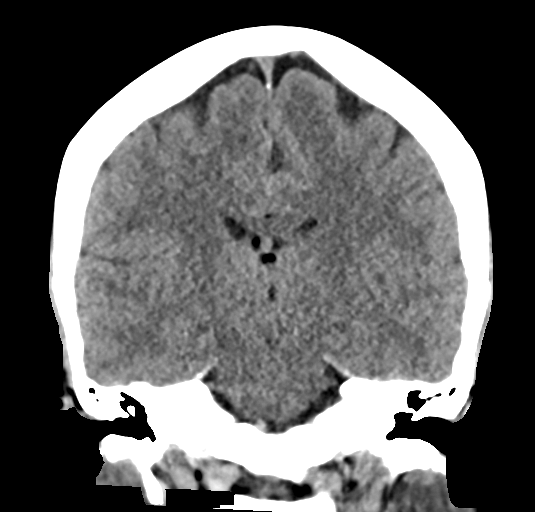

[Series 6: sag soft · sagittal · 0.32mm/px · 3 of 53 slices shown]
[im 18/53  brain]
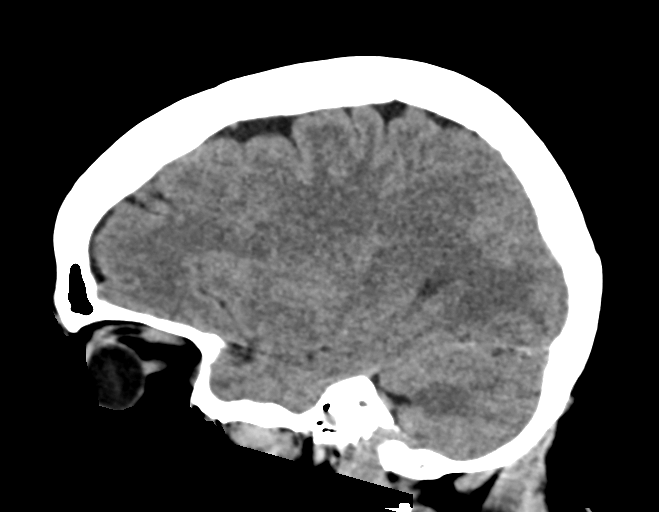
[im 27/53  brain]
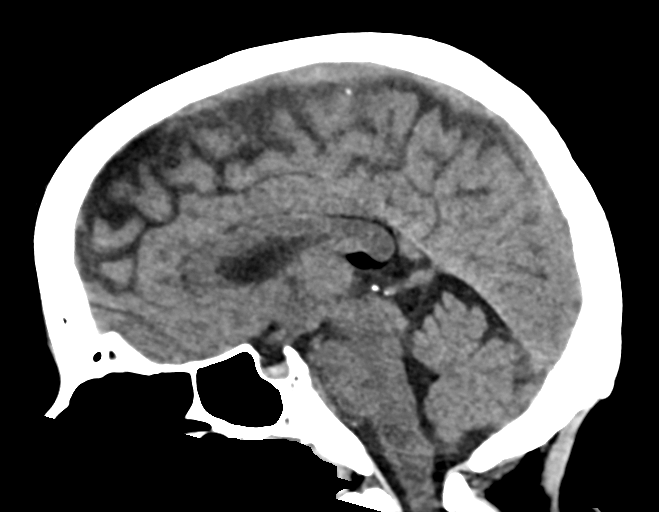
[im 35/53  brain]
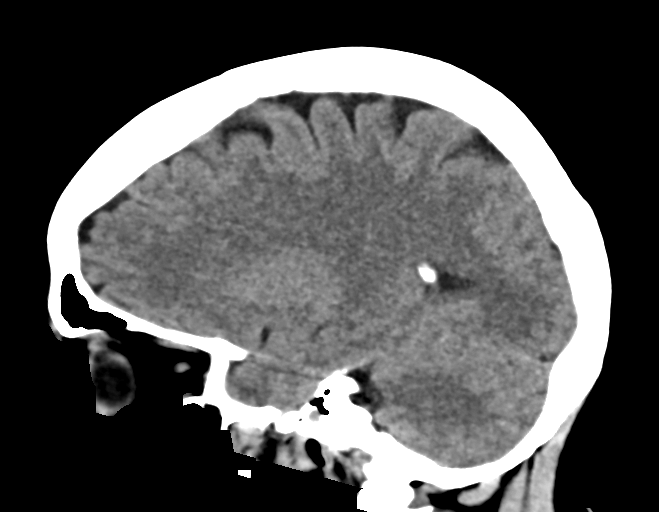

[17 of 47 positions shown; findings below may reference images not displayed]

FINDINGS: Brain: No hemorrhage, hydrocephalus, acute infarction. Fat density
structure wraps around the posterior corpus callosum some compatible
with lipoma.

Vascular: No hyperdense vessel or unexpected calcification.

Skull: No acute calvarial abnormality.

Sinuses/Orbits: Visualized paranasal sinuses and mastoids clear.
Orbital soft tissues unremarkable.

Other: None
IMPRESSION: No acute intracranial abnormality.

## 2021-10-06 ENCOUNTER — Other Ambulatory Visit: Payer: Self-pay | Admitting: Nurse Practitioner

## 2021-10-06 MED ORDER — DIAZEPAM 2 MG PO TABS: ORAL_TABLET | ORAL | 0 refills | Status: DC

## 2021-12-26 ENCOUNTER — Ambulatory Visit: Admitting: Nurse Practitioner

## 2021-12-29 ENCOUNTER — Other Ambulatory Visit

## 2021-12-29 ENCOUNTER — Other Ambulatory Visit: Payer: Self-pay

## 2021-12-29 DIAGNOSIS — R0981 Nasal congestion: Secondary | ICD-10-CM

## 2021-12-30 LAB — NOVEL CORONAVIRUS, NAA: SARS-CoV-2, NAA: NOT DETECTED

## 2022-01-03 ENCOUNTER — Telehealth (INDEPENDENT_AMBULATORY_CARE_PROVIDER_SITE_OTHER): Admitting: Nurse Practitioner

## 2022-01-03 ENCOUNTER — Encounter: Payer: Self-pay | Admitting: Nurse Practitioner

## 2022-01-03 VITALS — BP 117/72 | Temp 98.0°F | Ht 64.0 in | Wt 192.0 lb

## 2022-01-03 DIAGNOSIS — R5383 Other fatigue: Secondary | ICD-10-CM

## 2022-01-03 DIAGNOSIS — R051 Acute cough: Secondary | ICD-10-CM

## 2022-01-03 DIAGNOSIS — D508 Other iron deficiency anemias: Secondary | ICD-10-CM | POA: Diagnosis not present

## 2022-01-03 DIAGNOSIS — J069 Acute upper respiratory infection, unspecified: Secondary | ICD-10-CM

## 2022-01-03 MED ORDER — BENZONATATE 100 MG PO CAPS: 100.0000 mg | ORAL_CAPSULE | Freq: Four times a day (QID) | ORAL | 1 refills | Status: AC | PRN

## 2022-01-03 MED ORDER — AMOXICILLIN-POT CLAVULANATE 875-125 MG PO TABS: 1.0000 | ORAL_TABLET | Freq: Two times a day (BID) | ORAL | 0 refills | Status: DC

## 2022-01-03 NOTE — Progress Notes (Signed)
Virtual Visit via MyChart   This visit type was conducted due to national recommendations for restrictions regarding the COVID-19 Pandemic (e.g. social distancing) in an effort to limit this patient's exposure and mitigate transmission in our community.  Due to her co-morbid illnesses, this patient is at least at moderate risk for complications without adequate follow up.  This format is felt to be most appropriate for this patient at this time.  All issues noted in this document were discussed and addressed.  A limited physical exam was performed with this format.    This visit type was conducted due to national recommendations for restrictions regarding the COVID-19 Pandemic (e.g. social distancing) in an effort to limit this patient's exposure and mitigate transmission in our community.  Patients identity confirmed using two different identifiers.  This format is felt to be most appropriate for this patient at this time.  All issues noted in this document were discussed and addressed.  No physical exam was performed (except for noted visual exam findings with Video Visits).    Date:  01/15/2022   ID:  Tanya Nones, DOB 09-10-1960, MRN [id number]  Patient Location:  Work - spoke with Wendall Stade  Provider location:   Office    Chief Complaint:  cold symptoms  History of Present Illness:    EDWARD TREVINO is a 61 y.o. female who presents via video conferencing for a telehealth visit today.    The patient does not have symptoms concerning for COVID-19 infection (fever, chills, cough, or new shortness of breath).   Patient presents on a virtual visit today for cold symptoms she states its been going on for 10 days. Patient states her throat hurts not as bad, sinus drainage, cough and she now has diarrhea (x 2 today) and her energy is really low. She also cant sleep and decreased appetite. She is eating solid foods and protein drinks. She had taken 2 covid test at home and both were  negative. Every day feels a little better and taking a little more time to feel better.      Past Medical History:  Diagnosis Date   Hypertension    Past Surgical History:  Procedure Laterality Date   ABDOMINAL HYSTERECTOMY     partial, secondary to bleeding and anemia   APPENDECTOMY     CHOLECYSTECTOMY     GASTRIC BYPASS       Current Meds  Medication Sig   amoxicillin-clavulanate (AUGMENTIN) 875-125 MG tablet Take 1 tablet by mouth 2 (two) times daily.   benzonatate (TESSALON PERLES) 100 MG capsule Take 1 capsule (100 mg total) by mouth every 6 (six) hours as needed for cough.     Allergies:   Aspirin and Codeine   Social History   Tobacco Use   Smoking status: Never   Smokeless tobacco: Never  Vaping Use   Vaping Use: Never used  Substance Use Topics   Alcohol use: No   Drug use: No     Family Hx: The patient's family history includes Allergies in her son; COPD in her mother; Diabetes in her brother; Heart attack in her brother; Hemolytic uremic syndrome in her father; Hypertension in her brother.  ROS:   Please see the history of present illness.    Review of Systems  Constitutional:  Positive for malaise/fatigue. Negative for chills and fever.  Eyes: Negative.   Respiratory:  Positive for cough and sputum production (yellow/green). Negative for shortness of breath and wheezing.   Cardiovascular:  Negative.   Musculoskeletal:  Positive for myalgias.  Neurological:  Negative for dizziness.  Psychiatric/Behavioral:  Negative for depression.     All other systems reviewed and are negative.   Labs/Other Tests and Data Reviewed:    Recent Labs: 05/24/2021: ALT 16 01/04/2022: BUN 14; Creatinine, Ser 0.98; Hemoglobin 13.5; Platelets 296; Potassium 4.6; Sodium 141; TSH 1.340   Recent Lipid Panel Lab Results  Component Value Date/Time   CHOL 187 01/21/2020 12:59 PM   TRIG 73 01/21/2020 12:59 PM   HDL 64 01/21/2020 12:59 PM   CHOLHDL 2.9 01/21/2020 12:59 PM    LDLCALC 110 (H) 01/21/2020 12:59 PM    Wt Readings from Last 3 Encounters:  01/03/22 192 lb (87.1 kg)  08/24/21 197 lb (89.4 kg)  05/24/21 205 lb 11.2 oz (93.3 kg)     Exam:    Vital Signs:  BP 117/72 Comment: unable to obtain  Temp 98 F (36.7 C) (Oral)   Ht 5' 4"  (1.626 m)   Wt 192 lb (87.1 kg)   BMI 32.96 kg/m     Physical Exam Vitals reviewed.  Constitutional:      General: She is not in acute distress.    Appearance: Normal appearance.  Pulmonary:     Effort: Pulmonary effort is normal. No respiratory distress.     Breath sounds: No wheezing.  Skin:    General: Skin is warm and dry.  Neurological:     General: No focal deficit present.     Mental Status: She is alert and oriented to person, place, and time.     Cranial Nerves: No cranial nerve deficit.  Psychiatric:        Mood and Affect: Mood normal.        Behavior: Behavior normal.        Thought Content: Thought content normal.        Judgment: Judgment normal.     ASSESSMENT & PLAN:    1. Other fatigue She is to come in for labs to check for metabolic causes.  - BMP8+eGFR; Future - CBC; Future - TSH; Future - Vitamin B12; Future  2. Acute cough She is to go for a CXR due to cold symptoms for at least 10 days.  - DG Chest 2 View; Future  3. Iron deficiency anemia secondary to inadequate dietary iron intake Will recheck iron levels.  - Iron, TIBC and Ferritin Panel; Future  4. Upper respiratory tract infection, unspecified type Due to 10 day history of cold symptoms will treat with antibiotics. - amoxicillin-clavulanate (AUGMENTIN) 875-125 MG tablet; Take 1 tablet by mouth 2 (two) times daily.  Dispense: 20 tablet; Refill: 0 - benzonatate (TESSALON PERLES) 100 MG capsule; Take 1 capsule (100 mg total) by mouth every 6 (six) hours as needed for cough.  Dispense: 30 capsule; Refill: 1   COVID-19 Education: The signs and symptoms of COVID-19 were discussed with the patient and how to seek care  for testing (follow up with PCP or arrange E-visit).  The importance of social distancing was discussed today.  Patient Risk:   After full review of this patients clinical status, I feel that they are at least moderate risk at this time.  Time:   Today, I have spent 10 minutes/ seconds with the patient with telehealth technology discussing above diagnoses.     Medication Adjustments/Labs and Tests Ordered: Current medicines are reviewed at length with the patient today.  Concerns regarding medicines are outlined above.   Tests Ordered: Orders  Placed This Encounter  Procedures   DG Chest 2 View   BMP8+eGFR   CBC   Iron, TIBC and Ferritin Panel   TSH   Vitamin B12    Medication Changes: Meds ordered this encounter  Medications   amoxicillin-clavulanate (AUGMENTIN) 875-125 MG tablet    Sig: Take 1 tablet by mouth 2 (two) times daily.    Dispense:  20 tablet    Refill:  0   benzonatate (TESSALON PERLES) 100 MG capsule    Sig: Take 1 capsule (100 mg total) by mouth every 6 (six) hours as needed for cough.    Dispense:  30 capsule    Refill:  1    Disposition:  Follow up prn , she is to come in next week for labs  Signed, [name], FNP

## 2022-01-04 ENCOUNTER — Other Ambulatory Visit: Payer: Self-pay | Admitting: Nurse Practitioner

## 2022-01-05 LAB — BMP8+EGFR
BUN/Creatinine Ratio: 14 (ref 9–23)
BUN: 14 mg/dL (ref 6–24)
CO2: 24 mmol/L (ref 20–29)
Calcium: 9.7 mg/dL (ref 8.7–10.2)
Chloride: 101 mmol/L (ref 96–106)
Creatinine, Ser: 0.98 mg/dL (ref 0.57–1.00)
Glucose: 88 mg/dL (ref 70–99)
Potassium: 4.6 mmol/L (ref 3.5–5.2)
Sodium: 141 mmol/L (ref 134–144)
eGFR: 66 mL/min/{1.73_m2} (ref >59)

## 2022-01-05 LAB — CBC WITH DIFFERENTIAL/PLATELET
Basophils Absolute: 0 10*3/uL (ref 0.0–0.2)
Basos: 1 %
EOS (ABSOLUTE): 0.1 10*3/uL (ref 0.0–0.4)
Eos: 2 %
Hematocrit: 41.5 % (ref 34.0–46.6)
Hemoglobin: 13.5 g/dL (ref 11.1–15.9)
Immature Grans (Abs): 0 10*3/uL (ref 0.0–0.1)
Immature Granulocytes: 0 %
Lymphocytes Absolute: 2.5 10*3/uL (ref 0.7–3.1)
Lymphs: 55 %
MCH: 28.4 pg (ref 26.6–33.0)
MCHC: 32.5 g/dL (ref 31.5–35.7)
MCV: 87 fL (ref 79–97)
Monocytes Absolute: 0.3 10*3/uL (ref 0.1–0.9)
Monocytes: 8 %
Neutrophils Absolute: 1.6 10*3/uL (ref 1.4–7.0)
Neutrophils: 34 %
Platelets: 296 10*3/uL (ref 150–450)
RBC: 4.76 x10E6/uL (ref 3.77–5.28)
RDW: 11.8 % (ref 11.7–15.4)
WBC: 4.6 10*3/uL (ref 3.4–10.8)

## 2022-01-05 LAB — IRON,TIBC AND FERRITIN PANEL
Ferritin: 170 ng/mL — ABNORMAL HIGH (ref 15–150)
Iron Saturation: 42 % (ref 15–55)
Iron: 124 ug/dL (ref 27–159)
Total Iron Binding Capacity: 294 ug/dL (ref 250–450)
UIBC: 170 ug/dL (ref 131–425)

## 2022-01-05 LAB — VITAMIN B12: Vitamin B-12: 682 pg/mL (ref 232–1245)

## 2022-01-05 LAB — TSH: TSH: 1.34 u[IU]/mL (ref 0.450–4.500)

## 2022-02-07 ENCOUNTER — Encounter: Payer: Self-pay | Admitting: Nurse Practitioner

## 2022-02-07 ENCOUNTER — Ambulatory Visit (INDEPENDENT_AMBULATORY_CARE_PROVIDER_SITE_OTHER): Admitting: Nurse Practitioner

## 2022-02-07 VITALS — BP 108/80 | HR 68 | Temp 98.2°F | Ht 64.0 in | Wt 194.4 lb

## 2022-02-07 DIAGNOSIS — E782 Mixed hyperlipidemia: Secondary | ICD-10-CM

## 2022-02-07 DIAGNOSIS — I10 Essential (primary) hypertension: Secondary | ICD-10-CM | POA: Diagnosis not present

## 2022-02-07 DIAGNOSIS — Z23 Encounter for immunization: Secondary | ICD-10-CM

## 2022-02-07 DIAGNOSIS — F5101 Primary insomnia: Secondary | ICD-10-CM

## 2022-02-07 MED ORDER — LISINOPRIL-HYDROCHLOROTHIAZIDE 10-12.5 MG PO TABS: 1.0000 | ORAL_TABLET | Freq: Every day | ORAL | 1 refills | Status: DC

## 2022-02-07 NOTE — Patient Instructions (Signed)
Hypertension, Adult ?Hypertension is another name for high blood pressure. High blood pressure forces your heart to work harder to pump blood. This can cause problems over time. ?There are two numbers in a blood pressure reading. There is a top number (systolic) over a bottom number (diastolic). It is best to have a blood pressure that is below 120/80. ?What are the causes? ?The cause of this condition is not known. Some other conditions can lead to high blood pressure. ?What increases the risk? ?Some lifestyle factors can make you more likely to develop high blood pressure: ?Smoking. ?Not getting enough exercise or physical activity. ?Being overweight. ?Having too much fat, sugar, calories, or salt (sodium) in your diet. ?Drinking too much alcohol. ?Other risk factors include: ?Having any of these conditions: ?Heart disease. ?Diabetes. ?High cholesterol. ?Kidney disease. ?Obstructive sleep apnea. ?Having a family history of high blood pressure and high cholesterol. ?Age. The risk increases with age. ?Stress. ?What are the signs or symptoms? ?High blood pressure may not cause symptoms. Very high blood pressure (hypertensive crisis) may cause: ?Headache. ?Fast or uneven heartbeats (palpitations). ?Shortness of breath. ?Nosebleed. ?Vomiting or feeling like you may vomit (nauseous). ?Changes in how you see. ?Very bad chest pain. ?Feeling dizzy. ?Seizures. ?How is this treated? ?This condition is treated by making healthy lifestyle changes, such as: ?Eating healthy foods. ?Exercising more. ?Drinking less alcohol. ?Your doctor may prescribe medicine if lifestyle changes do not help enough and if: ?Your top number is above 130. ?Your bottom number is above 80. ?Your personal target blood pressure may vary. ?Follow these instructions at home: ?Eating and drinking ? ?If told, follow the DASH eating plan. To follow this plan: ?Fill one half of your plate at each meal with fruits and vegetables. ?Fill one fourth of your plate  at each meal with whole grains. Whole grains include whole-wheat pasta, brown rice, and whole-grain bread. ?Eat or drink low-fat dairy products, such as skim milk or low-fat yogurt. ?Fill one fourth of your plate at each meal with low-fat (lean) proteins. Low-fat proteins include fish, chicken without skin, eggs, beans, and tofu. ?Avoid fatty meat, cured and processed meat, or chicken with skin. ?Avoid pre-made or processed food. ?Limit the amount of salt in your diet to less than 1,500 mg each day. ?Do not drink alcohol if: ?Your doctor tells you not to drink. ?You are pregnant, may be pregnant, or are planning to become pregnant. ?If you drink alcohol: ?Limit how much you have to: ?0-1 drink a day for women. ?0-2 drinks a day for men. ?Know how much alcohol is in your drink. In the [place] one drink equals one 12 oz bottle of beer (355 mL), one 5 oz glass of wine (148 mL), or one 1? oz glass of hard liquor (44 mL). ?Lifestyle ? ?Work with your doctor to stay at a healthy weight or to lose weight. Ask your doctor what the best weight is for you. ?Get at least 30 minutes of exercise that causes your heart to beat faster (aerobic exercise) most days of the week. This may include walking, swimming, or biking. ?Get at least 30 minutes of exercise that strengthens your muscles (resistance exercise) at least 3 days a week. This may include lifting weights or doing Pilates. ?Do not smoke or use any products that contain nicotine or tobacco. If you need help quitting, ask your doctor. ?Check your blood pressure at home as told by your doctor. ?Keep all follow-up visits. ?Medicines ?Take over-the-counter and prescription medicines   only as told by your doctor. Follow directions carefully. ?Do not skip doses of blood pressure medicine. The medicine does not work as well if you skip doses. Skipping doses also puts you at risk for problems. ?Ask your doctor about side effects or reactions to medicines that you should watch  for. ?Contact a doctor if: ?You think you are having a reaction to the medicine you are taking. ?You have headaches that keep coming back. ?You feel dizzy. ?You have swelling in your ankles. ?You have trouble with your vision. ?Get help right away if: ?You get a very bad headache. ?You start to feel mixed up (confused). ?You feel weak or numb. ?You feel faint. ?You have very bad pain in your: ?Chest. ?Belly (abdomen). ?You vomit more than once. ?You have trouble breathing. ?These symptoms may be an emergency. Get help right away. Call 911. ?Do not wait to see if the symptoms will go away. ?Do not drive yourself to the hospital. ?Summary ?Hypertension is another name for high blood pressure. ?High blood pressure forces your heart to work harder to pump blood. ?For most people, a normal blood pressure is less than 120/80. ?Making healthy choices can help lower blood pressure. If your blood pressure does not get lower with healthy choices, you may need to take medicine. ?This information is not intended to replace advice given to you by your health care provider. Make sure you discuss any questions you have with your health care provider. ?Document Revised: 02/10/2021 Document Reviewed: 02/10/2021 ?Elsevier Patient Education ? 2023 Elsevier Inc. ? ?

## 2022-02-07 NOTE — Progress Notes (Signed)
Cindy Cain,acting as a Education administrator for [name], FNP.,have documented all relevant documentation on the behalf of [name], FNP,as directed by  [name], FNP while in the presence of [name], [place].    Subjective:     Patient ID: Cindy Cain , female    DOB: 13-Sep-1960 , 61 y.o.   MRN: [id number]   Chief Complaint  Patient presents with   Hypertension    HPI  Patient presents today for a bp check. Patient reports compliance with her medications. Patient does not have any questions or concerns at this time.    BP Readings from Last 3 Encounters: 02/07/22 : 108/80 01/03/22 : 117/72 08/24/21 : 124/80  She is exercising "all the time", she is now doing strength training, she does 4 days of cardio. Intermittent fasting. She eats fried food on Sundays, low carb and high fiber. She does drink adequate water.   She works from home. She is always thinking about what did she forget.     Hypertension This is a chronic problem. The current episode started more than 1 year ago. The problem is unchanged. Pertinent negatives include no chest pain, headaches, palpitations or shortness of breath. Risk factors for coronary artery disease include sedentary lifestyle. Past treatments include ACE inhibitors and diuretics. Compliance problems include exercise.  There is no history of chronic renal disease.  Insomnia Primary symptoms: sleep disturbance (Taking melatonin or tylenol pm. Has been having problems for the last year. Has always had trouble getting to sleep), difficulty falling asleep.   The onset quality is gradual. The problem occurs nightly. Past treatments include medication (rain sounds). PMH includes: associated symptoms present, no hypertension, no depression.      Past Medical History:  Diagnosis Date   Hypertension      Family History  Problem Relation Age of Onset   COPD Mother    Hemolytic uremic syndrome Father    Heart attack Brother    Diabetes Brother     Hypertension Brother    Allergies Son      Current Outpatient Medications:    acetaminophen (TYLENOL) 325 MG tablet, Take by mouth., Disp: , Rfl:    CALCIUM PO, Take 1 tablet by mouth daily., Disp: , Rfl:    Cholecalciferol (VITAMIN D3) 125 MCG (5000 UT) CAPS, Take by mouth., Disp: , Rfl:    diazepam (VALIUM) 2 MG tablet, Take 1 tablet by mouth before dental procedure may repeat x 1, Disp: 6 tablet, Rfl: 0   diclofenac sodium (VOLTAREN) 1 % GEL, Apply 2 g topically 4 (four) times daily as needed., Disp: 100 g, Rfl: 1   docusate sodium (COLACE) 100 MG capsule, Take by mouth., Disp: , Rfl:    loratadine (CLARITIN) 10 MG tablet, Take 1 tablet (10 mg total) by mouth daily., Disp: 90 tablet, Rfl: 1   magnesium oxide (MAG-OX) 400 MG tablet, Take 400 mg by mouth daily., Disp: , Rfl:    meclizine (ANTIVERT) 12.5 MG tablet, Take 1 tablet (12.5 mg total) by mouth 3 (three) times daily as needed for dizziness., Disp: 30 tablet, Rfl: 0   Multiple Vitamins-Minerals (MULTIVITAMIN WITH MINERALS) tablet, Take 1 tablet by mouth daily., Disp: , Rfl:    Thiamine HCl (VITAMIN B-1) 250 MG tablet, Take by mouth., Disp: , Rfl:    benzonatate (TESSALON PERLES) 100 MG capsule, Take 1 capsule (100 mg total) by mouth every 6 (six) hours as needed for cough. (Patient not taking: Reported on 02/07/2022), Disp: 30  capsule, Rfl: 1   Blood Glucose Monitoring Suppl (FIFTY50 GLUCOSE METER 2.0) w/Device KIT, DX: Z98.84. E16.1 Use as instructed (Patient not taking: Reported on 02/07/2022), Disp: , Rfl:    glucose blood test strip, by Other route two (2) times a day as needed. (Patient not taking: Reported on 02/07/2022), Disp: , Rfl:    lisinopril-hydrochlorothiazide (ZESTORETIC) 10-12.5 MG tablet, Take 1 tablet by mouth daily., Disp: 90 tablet, Rfl: 1   Allergies  Allergen Reactions   Aspirin Other (See Comments)    Ulcers    Codeine Other (See Comments)    ulcers     Review of Systems  Constitutional: Negative.    Respiratory: Negative.  Negative for shortness of breath.   Cardiovascular: Negative.  Negative for chest pain and palpitations.  Neurological: Negative.  Negative for headaches.  Psychiatric/Behavioral:  Positive for sleep disturbance (Taking melatonin or tylenol pm. Has been having problems for the last year. Has always had trouble getting to sleep). Negative for depression and suicidal ideas. The patient has insomnia.      Today's Vitals   02/07/22 1439  BP: 108/80  Pulse: 68  Temp: 98.2 F (36.8 C)  TempSrc: Oral  SpO2: 98%  Weight: 194 lb 6.4 oz (88.2 kg)  Height: _0  (1.626 m)   Body mass index is 33.37 kg/m.  Wt Readings from Last 3 Encounters:  02/07/22 194 lb 6.4 oz (88.2 kg)  01/03/22 192 lb (87.1 kg)  08/24/21 197 lb (89.4 kg)    Objective:  Physical Exam Vitals reviewed.  Constitutional:      General: She is not in acute distress.    Appearance: Normal appearance.  Cardiovascular:     Rate and Rhythm: Normal rate and regular rhythm.     Pulses: Normal pulses.     Heart sounds: Normal heart sounds. No murmur heard. Pulmonary:     Effort: Pulmonary effort is normal. No respiratory distress.     Breath sounds: Normal breath sounds. No wheezing.  Skin:    General: Skin is warm and dry.     Capillary Refill: Capillary refill takes less than 2 seconds.  Neurological:     General: No focal deficit present.     Mental Status: She is alert and oriented to person, place, and time.     Cranial Nerves: No cranial nerve deficit.     Motor: No weakness.         Assessment And Plan:     1. Essential hypertension Comments: Blood pressur is controlled, continue current medications.  - lisinopril-hydrochlorothiazide (ZESTORETIC) 10-12.5 MG tablet; Take 1 tablet by mouth daily.  Dispense: 90 tablet; Refill: 1  2. Mixed hyperlipidemia Comments: Cholesterol levels are stable. Continue focusing on diet low in fat and increase fiber.  3. Need for influenza  vaccination - Flu Vaccine QUAD 6+ mos PF IM (Fluarix Quad PF)  4. Primary insomnia Comments: She is encouraged to use Calm, if not improved will consider prescription medications. Has taken multiple over the counter medications without relief     Patient was given opportunity to ask questions. Patient verbalized understanding of the plan and was able to repeat key elements of the plan. All questions were answered to their satisfaction.  [name], FNP   I, [name], FNP, have reviewed all documentation for this visit. The documentation on 02/07/22 for the exam, diagnosis, procedures, and orders are all accurate and complete.   IF YOU HAVE BEEN REFERRED TO A SPECIALIST, IT MAY TAKE 1-2  WEEKS TO SCHEDULE/PROCESS THE REFERRAL. IF YOU HAVE NOT HEARD FROM US/SPECIALIST IN TWO WEEKS, PLEASE GIVE [place] A CALL AT [phone].   THE PATIENT IS ENCOURAGED TO PRACTICE SOCIAL DISTANCING DUE TO THE COVID-19 PANDEMIC.

## 2022-05-22 ENCOUNTER — Other Ambulatory Visit: Payer: Self-pay

## 2022-05-22 DIAGNOSIS — D509 Iron deficiency anemia, unspecified: Secondary | ICD-10-CM

## 2022-05-23 ENCOUNTER — Other Ambulatory Visit: Payer: Self-pay

## 2022-05-23 ENCOUNTER — Inpatient Hospital Stay (HOSPITAL_BASED_OUTPATIENT_CLINIC_OR_DEPARTMENT_OTHER): Admitting: Hematology

## 2022-05-23 ENCOUNTER — Inpatient Hospital Stay: Attending: Hematology

## 2022-05-23 VITALS — BP 132/81 | HR 52 | Temp 97.3°F | Resp 20 | Wt 196.0 lb

## 2022-05-23 DIAGNOSIS — I10 Essential (primary) hypertension: Secondary | ICD-10-CM | POA: Insufficient documentation

## 2022-05-23 DIAGNOSIS — E538 Deficiency of other specified B group vitamins: Secondary | ICD-10-CM | POA: Diagnosis not present

## 2022-05-23 DIAGNOSIS — D509 Iron deficiency anemia, unspecified: Secondary | ICD-10-CM | POA: Diagnosis present

## 2022-05-23 DIAGNOSIS — Z9884 Bariatric surgery status: Secondary | ICD-10-CM | POA: Diagnosis not present

## 2022-05-23 LAB — CBC WITH DIFFERENTIAL (CANCER CENTER ONLY)
Abs Immature Granulocytes: 0 10*3/uL (ref 0.00–0.07)
Basophils Absolute: 0 10*3/uL (ref 0.0–0.1)
Basophils Relative: 1 %
Eosinophils Absolute: 0.2 10*3/uL (ref 0.0–0.5)
Eosinophils Relative: 4 %
HCT: 41.7 % (ref 36.0–46.0)
Hemoglobin: 13.8 g/dL (ref 12.0–15.0)
Immature Granulocytes: 0 %
Lymphocytes Relative: 62 %
Lymphs Abs: 2.7 10*3/uL (ref 0.7–4.0)
MCH: 29.4 pg (ref 26.0–34.0)
MCHC: 33.1 g/dL (ref 30.0–36.0)
MCV: 88.7 fL (ref 80.0–100.0)
Monocytes Absolute: 0.4 10*3/uL (ref 0.1–1.0)
Monocytes Relative: 10 %
Neutro Abs: 1 10*3/uL — ABNORMAL LOW (ref 1.7–7.7)
Neutrophils Relative %: 23 %
Platelet Count: 235 10*3/uL (ref 150–400)
RBC: 4.7 MIL/uL (ref 3.87–5.11)
RDW: 12.3 % (ref 11.5–15.5)
WBC Count: 4.3 10*3/uL (ref 4.0–10.5)
nRBC: 0 % (ref 0.0–0.2)

## 2022-05-23 LAB — CMP (CANCER CENTER ONLY)
ALT: 21 U/L (ref 0–44)
AST: 25 U/L (ref 15–41)
Albumin: 4.1 g/dL (ref 3.5–5.0)
Alkaline Phosphatase: 72 U/L (ref 38–126)
Anion gap: 4 — ABNORMAL LOW (ref 5–15)
BUN: 19 mg/dL (ref 6–20)
CO2: 33 mmol/L — ABNORMAL HIGH (ref 22–32)
Calcium: 9.7 mg/dL (ref 8.9–10.3)
Chloride: 103 mmol/L (ref 98–111)
Creatinine: 0.96 mg/dL (ref 0.44–1.00)
GFR, Estimated: >60 mL/min (ref >60)
Glucose, Bld: 74 mg/dL (ref 70–99)
Potassium: 3.8 mmol/L (ref 3.5–5.1)
Sodium: 140 mmol/L (ref 135–145)
Total Bilirubin: 0.5 mg/dL (ref 0.3–1.2)
Total Protein: 7.4 g/dL (ref 6.5–8.1)

## 2022-05-23 LAB — IRON AND IRON BINDING CAPACITY (CC-WL,HP ONLY)
Iron: 144 ug/dL (ref 28–170)
Saturation Ratios: 44 % — ABNORMAL HIGH (ref 10.4–31.8)
TIBC: 329 ug/dL (ref 250–450)
UIBC: 185 ug/dL (ref 148–442)

## 2022-05-23 LAB — FERRITIN: Ferritin: 64 ng/mL (ref 11–307)

## 2022-05-23 LAB — VITAMIN B12: Vitamin B-12: 319 pg/mL (ref 180–914)

## 2022-05-23 NOTE — Progress Notes (Signed)
HEMATOLOGY/ONCOLOGY CLINIC NOTE  Date of Service: 05/23/22    Patient Care Team: [name], FNP as PCP - General (General Practice)  GI- Mickel Duhamel CHIEF COMPLAINTS/PURPOSE OF CONSULTATION:  Follow-up for management of anemia  HISTORY OF PRESENTING ILLNESS:   Cindy Cain is a wonderful 62 y.o. female who has been referred to [place] by [name], NP for evaluation and management of anemia.  The pt reports she has been iron deficient for a long time. She notes that she has been on po  Iron for>10 yrs- multivitamin with iron, iron 45mg +23 mg, constipation. -heavy periods. Hysterectomy 2005. 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 No overt GI bleeding. No menstrual losses.  Interval History   Cindy Cain is a 62 y.o. 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  Patient was last seen by me on 05/24/2021 and was doing well overall.   Patient notes she has been doing well overall without any new medical concerns since our last visit. She does complain of being tired more than usual, which started around a week ago. She is staying physically active.   She denies fever, chills, night sweats, abdominal pain, chest pain, or leg swelling. She complains of insomnia, but still gets around 6-7 hours of sleep.   She denies of taking any new herbal supplement or having any new infection. Patient reports she was taking Ibuprofen 800 mg for around 2 months due to tooth ache. She also took amoxicillin for her  tooth ache as well. She is currently not taking any pain medication.  She has been taking iron supplement every other day. She is regularly taking vitamin-d supplement and vitamin-B12 supplement.   Patient is complaint with all of her medications.    -Discussed lab results from today, 05/23/2022, with the patient. CBC and CMP are stable.    MEDICAL HISTORY:  Past Medical History:  Diagnosis Date   Hypertension     SURGICAL HISTORY: Past Surgical History:  Procedure Laterality Date   ABDOMINAL HYSTERECTOMY     partial, secondary to bleeding and anemia   APPENDECTOMY     CHOLECYSTECTOMY     GASTRIC BYPASS      SOCIAL HISTORY: Social History   Socioeconomic History   Marital status: Married    Spouse name: Not on file   Number of children: Not on file   Years of education: Not on file   Highest education level: Not on file  Occupational History   Not on file  Tobacco Use   Smoking status: Never   Smokeless tobacco: Never  Vaping Use   Vaping Use: Never used  Substance and Sexual Activity   Alcohol use: No   Drug use: No   Sexual activity: Not on file  Other Topics Concern   Not on file  Social History Narrative   Not on file   Social Determinants of Health   Financial Resource Strain: Not on file  Food  Insecurity: Not on file  Transportation Needs: Not on file  Physical Activity: Not on file  Stress: Not on file  Social Connections: Not on file  Intimate Partner Violence: Not on file    FAMILY HISTORY: Family History  Problem Relation Age of Onset   COPD Mother    Hemolytic uremic syndrome Father    Heart attack Brother    Diabetes Brother    Hypertension Brother    Allergies Son   maternal uncle - "leukemia" - ?ALL at age 40 yrs  ALLERGIES:  is allergic to aspirin and codeine.  MEDICATIONS:  Current Outpatient Medications  Medication Sig Dispense Refill   acetaminophen (TYLENOL) 325 MG tablet Take by mouth.     benzonatate (TESSALON  PERLES) 100 MG capsule Take 1 capsule (100 mg total) by mouth every 6 (six) hours as needed for cough. (Patient not taking: Reported on 02/07/2022) 30 capsule 1   Blood Glucose Monitoring Suppl (FIFTY50 GLUCOSE METER 2.0) w/Device KIT DX: Z98.84. E16.1 Use as instructed (Patient not taking: Reported on 02/07/2022)     CALCIUM PO Take 1 tablet by mouth daily.     Cholecalciferol (VITAMIN D3) 125 MCG (5000 UT) CAPS Take by mouth.     diazepam (VALIUM) 2 MG tablet Take 1 tablet by mouth before dental procedure may repeat x 1 6 tablet 0   diclofenac sodium (VOLTAREN) 1 % GEL Apply 2 g topically 4 (four) times daily as needed. 100 g 1   docusate sodium (COLACE) 100 MG capsule Take by mouth.     glucose blood test strip by Other route two (2) times a day as needed. (Patient not taking: Reported on 02/07/2022)     lisinopril-hydrochlorothiazide (ZESTORETIC) 10-12.5 MG tablet Take 1 tablet by mouth daily. 90 tablet 1   loratadine (CLARITIN) 10 MG tablet Take 1 tablet (10 mg total) by mouth daily. 90 tablet 1   magnesium oxide (MAG-OX) 400 MG tablet Take 400 mg by mouth daily.     meclizine (ANTIVERT) 12.5 MG tablet Take 1 tablet (12.5 mg total) by mouth 3 (three) times daily as needed for dizziness. 30 tablet 0   Multiple Vitamins-Minerals (MULTIVITAMIN WITH MINERALS) tablet Take 1 tablet by mouth daily.     Thiamine HCl (VITAMIN B-1) 250 MG tablet Take by mouth.     No current facility-administered medications for this visit.    REVIEW OF SYSTEMS:   .10 Point review of Systems was done is negative except as noted above.    PHYSICAL EXAMINATION: ECOG PERFORMANCE STATUS: 1 - Symptomatic but completely ambulatory  . Vitals:   05/23/22 1022  BP: 132/81  Pulse: (!) 52  Resp: 20  Temp: (!) 97.3 F (36.3 C)  SpO2: 100%    Filed Weights   05/23/22 1022  Weight: 196 lb (88.9 kg)    .Body mass index is 33.64 kg/m. Marland Kitchen GENERAL:alert, in no acute distress and comfortable SKIN: no acute rashes,  no significant lesions EYES: conjunctiva are pink and non-injected, sclera anicteric OROPHARYNX: MMM, no exudates, no oropharyngeal erythema or ulceration NECK: supple, no JVD LYMPH:  no palpable lymphadenopathy in the cervical, axillary or inguinal regions LUNGS: clear to auscultation b/l with normal respiratory effort HEART: regular rate & rhythm ABDOMEN:  normoactive bowel sounds , non tender, not distended. Extremity: no pedal edema PSYCH: alert & oriented x 3 with fluent speech NEURO: no focal motor/sensory deficits   LABORATORY DATA:  I have reviewed the data as listed  .  Latest Ref Rng & Units 05/23/2022    9:43 AM 01/04/2022    5:40 PM 05/24/2021    9:58 AM  CBC  WBC 4.0 - 10.5 K/uL 4.3  4.6  4.6   Hemoglobin 12.0 - 15.0 g/dL 13.8  13.5  12.6   Hematocrit 36.0 - 46.0 % 41.7  41.5  39.3   Platelets 150 - 400 K/uL 235  296  225     .    Latest Ref Rng & Units 05/23/2022    9:43 AM 01/04/2022    5:40 PM 05/24/2021    9:58 AM  CMP  Glucose 70 - 99 mg/dL 74  88  83   BUN 6 - 20 mg/dL 19  14  22    Creatinine 0.44 - 1.00 mg/dL 0.96  0.98  0.96   Sodium 135 - 145 mmol/L 140  141  140   Potassium 3.5 - 5.1 mmol/L 3.8  4.6  3.9   Chloride 98 - 111 mmol/L 103  101  104   CO2 22 - 32 mmol/L 33  24  30   Calcium 8.9 - 10.3 mg/dL 9.7  9.7  9.4   Total Protein 6.5 - 8.1 g/dL 7.4   7.3   Total Bilirubin 0.3 - 1.2 mg/dL 0.5   0.4   Alkaline Phos 38 - 126 U/L 72   71   AST 15 - 41 U/L 25   18   ALT 0 - 44 U/L 21   16    . Lab Results  Component Value Date   IRON 144 05/23/2022   TIBC 329 05/23/2022   IRONPCTSAT 44 (H) 05/23/2022   (Iron and TIBC)  Lab Results  Component Value Date   FERRITIN 64 05/23/2022   B12 - 444--> 319  RADIOGRAPHIC STUDIES: I have personally reviewed the radiological images as listed and agreed with the findings in the report. No results found.  ASSESSMENT & PLAN:   62 yo with   1. 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 Cannot r/o GI losses  PLAN: -Lab results from 05/24/2021 were discussed in detail with the patient.  Her CBC is within normal limits with a hemoglobin of 12.6 with normal WBC count and platelets B12 within normal limits at 444 Ferritin of 121 and iron saturation of 42%. CMP within normal limits. Patient's iron levels are at goal and there is no indication for additional IV iron at this time.  The goal is to keep her ferritin close to or greater than 100. She can continue iron polysaccharide 150 mg p.o. daily for maintenance. Would recommend following up with her primary care physician at least every 6 months to monitor her CBC ferritin and iron profile and reconsulting [place] if the ferritin is less than 50 or she starts developing significant anemia. Continue sublingual B12 1000 mcg at least 4 days a week  FOLLOW-UP: RTC with Dr Irene Limbo with labs in 12 months  The total time spent in the appointment was 20 minutes* .  All of the patient's questions were answered with apparent satisfaction. The patient knows to call the clinic with any problems, questions or concerns.   Sullivan Lone MD MS AAHIVMS [facility] Hematology/Oncology Physician [facility]  .*Total Encounter Time as defined by the Centers for Medicare and Medicaid Services includes, in addition to the face-to-face time of a patient visit (documented in the  note above) non-face-to-face time: obtaining and reviewing outside history, ordering and reviewing medications, tests or procedures, care coordination (communications with other health care professionals or caregivers) and documentation in the medical record.   I, Cleda Mccreedy, am acting as a Education administrator for Sullivan Lone, MD.  .I have reviewed the above documentation for accuracy and completeness, and I agree with the above. Brunetta Genera MD

## 2022-05-26 ENCOUNTER — Encounter: Payer: Self-pay | Admitting: Hematology

## 2022-05-29 ENCOUNTER — Encounter: Payer: Self-pay | Admitting: Hematology

## 2022-07-24 ENCOUNTER — Other Ambulatory Visit: Payer: Self-pay | Admitting: Nurse Practitioner

## 2022-07-24 DIAGNOSIS — I10 Essential (primary) hypertension: Secondary | ICD-10-CM

## 2022-07-27 ENCOUNTER — Other Ambulatory Visit: Payer: Self-pay

## 2022-07-27 DIAGNOSIS — I10 Essential (primary) hypertension: Secondary | ICD-10-CM

## 2022-07-27 MED ORDER — LISINOPRIL-HYDROCHLOROTHIAZIDE 10-12.5 MG PO TABS: 1.0000 | ORAL_TABLET | Freq: Every day | ORAL | 0 refills | Status: DC

## 2022-08-26 ENCOUNTER — Other Ambulatory Visit: Payer: Self-pay | Admitting: Nurse Practitioner

## 2022-08-26 DIAGNOSIS — R0981 Nasal congestion: Secondary | ICD-10-CM

## 2022-08-26 DIAGNOSIS — I10 Essential (primary) hypertension: Secondary | ICD-10-CM

## 2022-08-28 ENCOUNTER — Other Ambulatory Visit: Payer: Self-pay | Admitting: Nurse Practitioner

## 2022-08-28 DIAGNOSIS — R0981 Nasal congestion: Secondary | ICD-10-CM

## 2022-08-28 DIAGNOSIS — I10 Essential (primary) hypertension: Secondary | ICD-10-CM

## 2022-08-29 ENCOUNTER — Other Ambulatory Visit: Payer: Self-pay | Admitting: Nurse Practitioner

## 2022-08-29 DIAGNOSIS — I10 Essential (primary) hypertension: Secondary | ICD-10-CM

## 2022-08-29 NOTE — Telephone Encounter (Signed)
Patient aware to keep OV scheduled for 09/07/22 or she will not receive additional refills.

## 2022-09-04 ENCOUNTER — Other Ambulatory Visit: Payer: Self-pay

## 2022-09-04 DIAGNOSIS — R0981 Nasal congestion: Secondary | ICD-10-CM

## 2022-09-04 MED ORDER — LORATADINE 10 MG PO TABS: 10.0000 mg | ORAL_TABLET | Freq: Every day | ORAL | 1 refills | Status: AC

## 2022-09-07 ENCOUNTER — Encounter: Payer: Self-pay | Admitting: Nurse Practitioner

## 2022-09-07 ENCOUNTER — Ambulatory Visit (INDEPENDENT_AMBULATORY_CARE_PROVIDER_SITE_OTHER): Admitting: Nurse Practitioner

## 2022-09-07 VITALS — BP 132/60 | HR 62 | Temp 97.9°F | Ht 64.0 in | Wt 187.0 lb

## 2022-09-07 DIAGNOSIS — Z2821 Immunization not carried out because of patient refusal: Secondary | ICD-10-CM | POA: Diagnosis not present

## 2022-09-07 DIAGNOSIS — E65 Localized adiposity: Secondary | ICD-10-CM

## 2022-09-07 DIAGNOSIS — I10 Essential (primary) hypertension: Secondary | ICD-10-CM | POA: Diagnosis not present

## 2022-09-07 DIAGNOSIS — Z1231 Encounter for screening mammogram for malignant neoplasm of breast: Secondary | ICD-10-CM | POA: Diagnosis not present

## 2022-09-07 MED ORDER — MECLIZINE HCL 12.5 MG PO TABS: 12.5000 mg | ORAL_TABLET | Freq: Three times a day (TID) | ORAL | 2 refills | Status: AC | PRN

## 2022-09-07 MED ORDER — LISINOPRIL-HYDROCHLOROTHIAZIDE 10-12.5 MG PO TABS
1.0000 | ORAL_TABLET | Freq: Every day | ORAL | 1 refills | Status: DC
Start: 2022-09-07 — End: 2023-03-19

## 2022-09-07 NOTE — Progress Notes (Signed)
Hershal Coria Martin,acting as a Neurosurgeon for Arnette Felts, FNP.,have documented all relevant documentation on the behalf of Arnette Felts, FNP,as directed by  Arnette Felts, FNP while in the presence of Arnette Felts, FNP.    Subjective:     Patient ID: Cindy Cain , female    DOB: [birth year] , 62 y.o.   MRN: [id number]   Chief Complaint  Patient presents with   Hypertension    HPI  Patient presents today for BP check, patient states compliance with medications and has no other concerns today. She is using lumi, powder and oils to her panus due to sweating and it will have an odor.   BP Readings from Last 3 Encounters: 09/07/22 : 132/60 05/23/22 : 132/81 02/07/22 : 108/80    Hypertension This is a chronic problem. The current episode started more than 1 year ago. The problem is controlled. Pertinent negatives include no anxiety.     Past Medical History:  Diagnosis Date   Hypertension      Family History  Problem Relation Age of Onset   COPD Mother    Hemolytic uremic syndrome Father    Heart attack Brother    Diabetes Brother    Hypertension Brother    Allergies Son      Current Outpatient Medications:    acetaminophen (TYLENOL) 325 MG tablet, Take by mouth., Disp: , Rfl:    CALCIUM PO, Take 1 tablet by mouth daily., Disp: , Rfl:    Cholecalciferol (VITAMIN D3) 125 MCG (5000 UT) CAPS, Take by mouth., Disp: , Rfl:    docusate sodium (COLACE) 100 MG capsule, Take by mouth., Disp: , Rfl:    loratadine (CLARITIN) 10 MG tablet, Take 1 tablet (10 mg total) by mouth daily., Disp: 90 tablet, Rfl: 1   magnesium oxide (MAG-OX) 400 MG tablet, Take 400 mg by mouth daily., Disp: , Rfl:    Multiple Vitamins-Minerals (MULTIVITAMIN WITH MINERALS) tablet, Take 1 tablet by mouth daily., Disp: , Rfl:    Thiamine HCl (VITAMIN B-1) 250 MG tablet, Take by mouth., Disp: , Rfl:    benzonatate (TESSALON PERLES) 100 MG capsule, Take 1 capsule (100 mg total) by mouth every 6 (six) hours as  needed for cough. (Patient not taking: Reported on 02/07/2022), Disp: 30 capsule, Rfl: 1   Blood Glucose Monitoring Suppl (FIFTY50 GLUCOSE METER 2.0) w/Device KIT, DX: Z98.84. E16.1 Use as instructed (Patient not taking: Reported on 02/07/2022), Disp: , Rfl:    diazepam (VALIUM) 2 MG tablet, Take 1 tablet by mouth before dental procedure may repeat x 1 (Patient not taking: Reported on 09/07/2022), Disp: 6 tablet, Rfl: 0   diclofenac sodium (VOLTAREN) 1 % GEL, Apply 2 g topically 4 (four) times daily as needed. (Patient not taking: Reported on 09/07/2022), Disp: 100 g, Rfl: 1   glucose blood test strip, by Other route two (2) times a day as needed. (Patient not taking: Reported on 02/07/2022), Disp: , Rfl:    lisinopril-hydrochlorothiazide (ZESTORETIC) 10-12.5 MG tablet, Take 1 tablet by mouth daily., Disp: 90 tablet, Rfl: 1   meclizine (ANTIVERT) 12.5 MG tablet, Take 1 tablet (12.5 mg total) by mouth 3 (three) times daily as needed for dizziness., Disp: 30 tablet, Rfl: 2   Allergies  Allergen Reactions   Aspirin Other (See Comments)    Ulcers    Codeine Other (See Comments)    ulcers     Review of Systems  Constitutional: Negative.   HENT: Negative.    Eyes: Negative.  Respiratory: Negative.    Cardiovascular: Negative.   Gastrointestinal: Negative.      Today's Vitals   09/07/22 1048  BP: 132/60  Pulse: 62  Temp: 97.9 F (36.6 C)  TempSrc: Oral  Weight: 187 lb (84.8 kg)  Height: 5\' 4"  (1.626 m)  PainSc: 0-No pain   Body mass index is 32.1 kg/m.  Wt Readings from Last 3 Encounters:  09/07/22 187 lb (84.8 kg)  05/23/22 196 lb (88.9 kg)  02/07/22 194 lb 6.4 oz (88.2 kg)    Objective:  Physical Exam Vitals reviewed.  Constitutional:      General: She is not in acute distress.    Appearance: Normal appearance.  Cardiovascular:     Rate and Rhythm: Normal rate and regular rhythm.     Pulses: Normal pulses.     Heart sounds: Normal heart sounds. No murmur heard. Pulmonary:      Effort: Pulmonary effort is normal. No respiratory distress.     Breath sounds: Normal breath sounds. No wheezing.  Skin:    General: Skin is warm and dry.     Capillary Refill: Capillary refill takes less than 2 seconds.     Findings: Erythema (underneath panniculus) present.  Neurological:     General: No focal deficit present.     Mental Status: She is alert and oriented to person, place, and time.     Cranial Nerves: No cranial nerve deficit.     Motor: No weakness.  Psychiatric:        Mood and Affect: Mood normal.        Behavior: Behavior normal.        Thought Content: Thought content normal.        Judgment: Judgment normal.         Assessment And Plan:     1. Essential hypertension Comments: Blood pressur is controlled, continue current medications.  - lisinopril-hydrochlorothiazide (ZESTORETIC) 10-12.5 MG tablet; Take 1 tablet by mouth daily.  Dispense: 90 tablet; Refill: 1 - BMP8+eGFR  2. Symptomatic abdominal panniculus Comments: moisture and sweaty all the time worse when exercises. Will keep paper underneath. Will refer to plastic surgeon to evaluate for panniculectomy - Ambulatory referral to Plastic Surgery  3. Herpes zoster vaccination declined Declines shingrix, educated on disease process and is aware if he changes his mind to notify office  4. Tetanus, diphtheria, and acellular pertussis (Tdap) vaccination declined  5. Encounter for screening mammogram for breast cancer Pt instructed on Self Breast Exam.According to ACOG guidelines Women aged 14 and older are recommended to get an annual mammogram. Order completed  Pt encouraged to get annual mammogram - MM Digital Screening; Future     Patient was given opportunity to ask questions. Patient verbalized understanding of the plan and was able to repeat key elements of the plan. All questions were answered to their satisfaction.  Arnette Felts, FNP   I, Arnette Felts, FNP, have reviewed all  documentation for this visit. The documentation on 09/07/22 for the exam, diagnosis, procedures, and orders are all accurate and complete.   IF YOU HAVE BEEN REFERRED TO A SPECIALIST, IT MAY TAKE 1-2 WEEKS TO SCHEDULE/PROCESS THE REFERRAL. IF YOU HAVE NOT HEARD FROM US/SPECIALIST IN TWO WEEKS, PLEASE GIVE [place] A CALL AT [phone].   THE PATIENT IS ENCOURAGED TO PRACTICE SOCIAL DISTANCING DUE TO THE COVID-19 PANDEMIC.

## 2022-09-07 NOTE — Patient Instructions (Signed)
Hypertension, Adult ?Hypertension is another name for high blood pressure. High blood pressure forces your heart to work harder to pump blood. This can cause problems over time. ?There are two numbers in a blood pressure reading. There is a top number (systolic) over a bottom number (diastolic). It is best to have a blood pressure that is below 120/80. ?What are the causes? ?The cause of this condition is not known. Some other conditions can lead to high blood pressure. ?What increases the risk? ?Some lifestyle factors can make you more likely to develop high blood pressure: ?Smoking. ?Not getting enough exercise or physical activity. ?Being overweight. ?Having too much fat, sugar, calories, or salt (sodium) in your diet. ?Drinking too much alcohol. ?Other risk factors include: ?Having any of these conditions: ?Heart disease. ?Diabetes. ?High cholesterol. ?Kidney disease. ?Obstructive sleep apnea. ?Having a family history of high blood pressure and high cholesterol. ?Age. The risk increases with age. ?Stress. ?What are the signs or symptoms? ?High blood pressure may not cause symptoms. Very high blood pressure (hypertensive crisis) may cause: ?Headache. ?Fast or uneven heartbeats (palpitations). ?Shortness of breath. ?Nosebleed. ?Vomiting or feeling like you may vomit (nauseous). ?Changes in how you see. ?Very bad chest pain. ?Feeling dizzy. ?Seizures. ?How is this treated? ?This condition is treated by making healthy lifestyle changes, such as: ?Eating healthy foods. ?Exercising more. ?Drinking less alcohol. ?Your doctor may prescribe medicine if lifestyle changes do not help enough and if: ?Your top number is above 130. ?Your bottom number is above 80. ?Your personal target blood pressure may vary. ?Follow these instructions at home: ?Eating and drinking ? ?If told, follow the DASH eating plan. To follow this plan: ?Fill one half of your plate at each meal with fruits and vegetables. ?Fill one fourth of your plate  at each meal with whole grains. Whole grains include whole-wheat pasta, brown rice, and whole-grain bread. ?Eat or drink low-fat dairy products, such as skim milk or low-fat yogurt. ?Fill one fourth of your plate at each meal with low-fat (lean) proteins. Low-fat proteins include fish, chicken without skin, eggs, beans, and tofu. ?Avoid fatty meat, cured and processed meat, or chicken with skin. ?Avoid pre-made or processed food. ?Limit the amount of salt in your diet to less than 1,500 mg each day. ?Do not drink alcohol if: ?Your doctor tells you not to drink. ?You are pregnant, may be pregnant, or are planning to become pregnant. ?If you drink alcohol: ?Limit how much you have to: ?0-1 drink a day for women. ?0-2 drinks a day for men. ?Know how much alcohol is in your drink. In the [place] one drink equals one 12 oz bottle of beer (355 mL), one 5 oz glass of wine (148 mL), or one 1? oz glass of hard liquor (44 mL). ?Lifestyle ? ?Work with your doctor to stay at a healthy weight or to lose weight. Ask your doctor what the best weight is for you. ?Get at least 30 minutes of exercise that causes your heart to beat faster (aerobic exercise) most days of the week. This may include walking, swimming, or biking. ?Get at least 30 minutes of exercise that strengthens your muscles (resistance exercise) at least 3 days a week. This may include lifting weights or doing Pilates. ?Do not smoke or use any products that contain nicotine or tobacco. If you need help quitting, ask your doctor. ?Check your blood pressure at home as told by your doctor. ?Keep all follow-up visits. ?Medicines ?Take over-the-counter and prescription medicines   only as told by your doctor. Follow directions carefully. ?Do not skip doses of blood pressure medicine. The medicine does not work as well if you skip doses. Skipping doses also puts you at risk for problems. ?Ask your doctor about side effects or reactions to medicines that you should watch  for. ?Contact a doctor if: ?You think you are having a reaction to the medicine you are taking. ?You have headaches that keep coming back. ?You feel dizzy. ?You have swelling in your ankles. ?You have trouble with your vision. ?Get help right away if: ?You get a very bad headache. ?You start to feel mixed up (confused). ?You feel weak or numb. ?You feel faint. ?You have very bad pain in your: ?Chest. ?Belly (abdomen). ?You vomit more than once. ?You have trouble breathing. ?These symptoms may be an emergency. Get help right away. Call 911. ?Do not wait to see if the symptoms will go away. ?Do not drive yourself to the hospital. ?Summary ?Hypertension is another name for high blood pressure. ?High blood pressure forces your heart to work harder to pump blood. ?For most people, a normal blood pressure is less than 120/80. ?Making healthy choices can help lower blood pressure. If your blood pressure does not get lower with healthy choices, you may need to take medicine. ?This information is not intended to replace advice given to you by your health care provider. Make sure you discuss any questions you have with your health care provider. ?Document Revised: 02/10/2021 Document Reviewed: 02/10/2021 ?Elsevier Patient Education ? 2023 Elsevier Inc. ? ?

## 2022-09-08 LAB — BMP8+EGFR
BUN/Creatinine Ratio: 22 (ref 12–28)
BUN: 23 mg/dL (ref 8–27)
CO2: 24 mmol/L (ref 20–29)
Calcium: 9.5 mg/dL (ref 8.7–10.3)
Chloride: 100 mmol/L (ref 96–106)
Creatinine, Ser: 1.03 mg/dL — ABNORMAL HIGH (ref 0.57–1.00)
Glucose: 72 mg/dL (ref 70–99)
Potassium: 4.3 mmol/L (ref 3.5–5.2)
Sodium: 140 mmol/L (ref 134–144)
eGFR: 62 mL/min/{1.73_m2} (ref >59)

## 2022-10-31 ENCOUNTER — Ambulatory Visit: Admitting: Plastic Surgery

## 2022-10-31 ENCOUNTER — Encounter: Payer: Self-pay | Admitting: Plastic Surgery

## 2022-10-31 VITALS — BP 116/76 | HR 66 | Ht 64.5 in | Wt 180.8 lb

## 2022-10-31 DIAGNOSIS — M793 Panniculitis, unspecified: Secondary | ICD-10-CM | POA: Diagnosis not present

## 2022-10-31 DIAGNOSIS — R21 Rash and other nonspecific skin eruption: Secondary | ICD-10-CM

## 2022-10-31 DIAGNOSIS — Z6830 Body mass index (BMI) 30.0-30.9, adult: Secondary | ICD-10-CM

## 2022-10-31 NOTE — Progress Notes (Signed)
Referring Provider Arnette Felts, FNP [address] [place],  [place] [address]   CC:  Chief Complaint  Patient presents with   Consult      Cindy Cain is an 62 y.o. female.  HPI: Cindy Cain is a 62 year old female who presents today with complaints of excess skin on her anterior abdominal wall which causes rashes on the posterior aspect of the skin in the intertriginous regions.  She states that she has been having difficulty with this for the past 2 years and has seen her primary care physician multiple times regarding rash creams and ointments.  Of note the patient underwent a gastric sleeve in 2014 and has lost approximately 90 pounds.  Allergies  Allergen Reactions   Aspirin Other (See Comments)    Ulcers    Codeine Other (See Comments)    ulcers    Outpatient Encounter Medications as of 10/31/2022  Medication Sig   acetaminophen (TYLENOL) 325 MG tablet Take by mouth.   benzonatate (TESSALON PERLES) 100 MG capsule Take 1 capsule (100 mg total) by mouth every 6 (six) hours as needed for cough.   Blood Glucose Monitoring Suppl (FIFTY50 GLUCOSE METER 2.0) w/Device KIT    CALCIUM PO Take 1 tablet by mouth daily.   Cholecalciferol (VITAMIN D3) 125 MCG (5000 UT) CAPS Take by mouth.   diazepam (VALIUM) 2 MG tablet Take 1 tablet by mouth before dental procedure may repeat x 1   diclofenac sodium (VOLTAREN) 1 % GEL Apply 2 g topically 4 (four) times daily as needed.   docusate sodium (COLACE) 100 MG capsule Take by mouth.   glucose blood test strip    lisinopril-hydrochlorothiazide (ZESTORETIC) 10-12.5 MG tablet Take 1 tablet by mouth daily.   loratadine (CLARITIN) 10 MG tablet Take 1 tablet (10 mg total) by mouth daily.   magnesium oxide (MAG-OX) 400 MG tablet Take 400 mg by mouth daily.   meclizine (ANTIVERT) 12.5 MG tablet Take 1 tablet (12.5 mg total) by mouth 3 (three) times daily as needed for dizziness.   Multiple Vitamins-Minerals (MULTIVITAMIN WITH MINERALS)  tablet Take 1 tablet by mouth daily.   Thiamine HCl (VITAMIN B-1) 250 MG tablet Take by mouth.   No facility-administered encounter medications on file as of 10/31/2022.     Past Medical History:  Diagnosis Date   Hypertension     Past Surgical History:  Procedure Laterality Date   ABDOMINAL HYSTERECTOMY     partial, secondary to bleeding and anemia   APPENDECTOMY     CHOLECYSTECTOMY     GASTRIC BYPASS      Family History  Problem Relation Age of Onset   COPD Mother    Hemolytic uremic syndrome Father    Heart attack Brother    Diabetes Brother    Hypertension Brother    Allergies Son     Social History   Social History Narrative   Not on file     Review of Systems General: Denies fevers, chills, weight loss CV: Denies chest pain, shortness of breath, palpitations Pannus: Patient has a moderate to large amount of excess skin on her anterior abdominal wall which frequently has rashes especially when she is sweating.  She has asked her primary care doctor multiple times for medication to treat the rashes.  Physical Exam    10/31/2022    2:27 PM 09/07/2022   10:48 AM 05/23/2022   10:22 AM  Vitals with BMI  Height 5' 4.5" 5\' 4"    Weight  180 lbs 13 oz 187 lbs 196 lbs  BMI 30.57 32.08   Systolic 116 132 865  Diastolic 76 60 81  Pulse 66 62 52    General:  No acute distress,  Alert and oriented, Non-Toxic, Normal speech and affect Pannus: Patient has a moderate pannus which extends down to her symphysis pubis.  She also has excess skin above her umbilicus. Mammogram: No mammogram results available since 2022.  Will offer the patient an order for a current mammogram Assessment/Plan Pannus: Patient reports a 2-year history of recurrent rashes especially when she sweats.  These rashes occur on the posterior aspect of her pannus in the intertriginous regions.  She is also bothered by the smell when this happens.  She is requesting removal of the excess skin.  We discussed  panniculectomy at length including the location of the incisions what a panniculectomy would address and what it would not.  We discussed the postoperative restrictions after surgery including no heavy lifting greater than 20 pounds, no vigorous activity, no submerging incisions in water for 6 weeks.  We discussed the need for drains which may be an up to 4 weeks postoperatively.  We discussed the use of compression which she will need for 6 weeks postoperatively.  The patient is also a good candidate for an upper abdominal add-on if she is interested.  Would like a quote for this and will make a decision based on the cost.  All questions were answered to her satisfaction today.  Photographs were obtained today with her consent.  Will submit her for a panniculectomy with possible upper abdominal add-on at her request.  [name] 10/31/2022, 2:55 PM

## 2022-11-08 ENCOUNTER — Encounter: Payer: Self-pay | Admitting: *Deleted

## 2022-12-27 ENCOUNTER — Encounter

## 2022-12-27 ENCOUNTER — Telehealth (INDEPENDENT_AMBULATORY_CARE_PROVIDER_SITE_OTHER): Admitting: Nurse Practitioner

## 2022-12-27 ENCOUNTER — Encounter: Payer: Self-pay | Admitting: Nurse Practitioner

## 2022-12-27 VITALS — Temp 101.5°F

## 2022-12-27 DIAGNOSIS — U071 COVID-19: Secondary | ICD-10-CM | POA: Diagnosis not present

## 2022-12-27 MED ORDER — NIRMATRELVIR/RITONAVIR (PAXLOVID)TABLET
3.0000 | ORAL_TABLET | Freq: Two times a day (BID) | ORAL | 0 refills | Status: DC
Start: 2022-12-27 — End: 2022-12-28

## 2022-12-27 NOTE — Assessment & Plan Note (Signed)
Advised patient to take Vitamin C, D, Zinc.  Keep yourself hydrated with a lot of water and rest. Take Delsym for cough and Mucinex as need. Take Tylenol or pain reliever every 4-6 hours as needed for pain/fever/body ache. If you have elevated blood pressure, you can take OTC Corcidin. You can also take OTC oscillococcinum to help with your symptoms.  Educated patient if symptoms get worse or if she experiences any SOB, chest pain or pain in her legs to seek immediate emergency care. Continue to monitor your oxygen levels. Call us if you have any questions. Quarantine for 5 days and wear for additional 5 days after quarantine.

## 2022-12-27 NOTE — Patient Instructions (Addendum)
Advised patient to take Vitamin C, D, Zinc.  Keep yourself hydrated with a lot of water and rest. Take Delsym for cough and Mucinex as need. Take Tylenol or pain reliever every 4-6 hours as needed for pain/fever/body ache. If you have elevated blood pressure, you can take OTC Corcidin. You can also take OTC oscillococcinum to help with your symptoms.  Educated patient if symptoms get worse or if she experiences any SOB, chest pain or pain in her legs to seek immediate emergency care. Continue to monitor your oxygen levels. Call us if you have any questions. Quarantine for 5 days and wear your mask for 5 more days after when around other people.

## 2022-12-27 NOTE — Progress Notes (Signed)
Virtual Visit via Mychart   This visit type was conducted due to national recommendations for restrictions regarding the COVID-19 Pandemic (e.g. social distancing) in an effort to limit this patient's exposure and mitigate transmission in our community.  Due to her co-morbid illnesses, this patient is at least at moderate risk for complications without adequate follow up.  This format is felt to be most appropriate for this patient at this time.  All issues noted in this document were discussed and addressed.  A limited physical exam was performed with this format.    This visit type was conducted due to national recommendations for restrictions regarding the COVID-19 Pandemic (e.g. social distancing) in an effort to limit this patient's exposure and mitigate transmission in our community.  Patients identity confirmed using two different identifiers.  This format is felt to be most appropriate for this patient at this time.  All issues noted in this document were discussed and addressed.  No physical exam was performed (except for noted visual exam findings with Video Visits).    Date:  12/27/2022   ID:  Cindy Cain, DOB 02-Feb-1961, MRN [id number]  Patient Location:  Home - spoke with Bascom Levels  Provider location:   Office    Chief Complaint:  positive home test  History of Present Illness:    Cindy Cain is a 62 y.o. female who presents via video conferencing for a telehealth visit today.    The patient does have symptoms concerning for COVID-19 infection (fever, chills, cough, or new shortness of breath).   Patient presents today for testing positive today for Covid, patient reports her throat started yesterday. She has been around multiple people at events at church. Today patient has a headache, fever, chills, sore throat, muscle aches vomited x 1 today and runny nose. She has taken nyquil and dayquil.      Past Medical History:  Diagnosis Date   Hypertension    Past  Surgical History:  Procedure Laterality Date   ABDOMINAL HYSTERECTOMY     partial, secondary to bleeding and anemia   APPENDECTOMY     CHOLECYSTECTOMY     GASTRIC BYPASS       Current Meds  Medication Sig   acetaminophen (TYLENOL) 325 MG tablet Take by mouth.   benzonatate (TESSALON PERLES) 100 MG capsule Take 1 capsule (100 mg total) by mouth every 6 (six) hours as needed for cough.   Blood Glucose Monitoring Suppl (FIFTY50 GLUCOSE METER 2.0) w/Device KIT    CALCIUM PO Take 1 tablet by mouth daily.   Cholecalciferol (VITAMIN D3) 125 MCG (5000 UT) CAPS Take by mouth.   diazepam (VALIUM) 2 MG tablet Take 1 tablet by mouth before dental procedure may repeat x 1   diclofenac sodium (VOLTAREN) 1 % GEL Apply 2 g topically 4 (four) times daily as needed.   docusate sodium (COLACE) 100 MG capsule Take by mouth.   glucose blood test strip    lisinopril-hydrochlorothiazide (ZESTORETIC) 10-12.5 MG tablet Take 1 tablet by mouth daily.   loratadine (CLARITIN) 10 MG tablet Take 1 tablet (10 mg total) by mouth daily.   magnesium oxide (MAG-OX) 400 MG tablet Take 400 mg by mouth daily.   meclizine (ANTIVERT) 12.5 MG tablet Take 1 tablet (12.5 mg total) by mouth 3 (three) times daily as needed for dizziness.   Multiple Vitamins-Minerals (MULTIVITAMIN WITH MINERALS) tablet Take 1 tablet by mouth daily.   nirmatrelvir/ritonavir (PAXLOVID) 20 x 150 MG & 10 x 100MG  TABS  Take 3 tablets by mouth 2 (two) times daily for 5 days. Patient GFR is 62. Take nirmatrelvir (150 mg) two tablets twice daily for 5 days and ritonavir (100 mg) one tablet twice daily for 5 days.   Thiamine HCl (VITAMIN B-1) 250 MG tablet Take by mouth.     Allergies:   Aspirin and Codeine   Social History   Tobacco Use   Smoking status: Never   Smokeless tobacco: Never  Vaping Use   Vaping status: Never Used  Substance Use Topics   Alcohol use: No   Drug use: No     Family Hx: The patient's family history includes Allergies in  her son; COPD in her mother; Diabetes in her brother; Heart attack in her brother; Hemolytic uremic syndrome in her father; Hypertension in her brother.  ROS:   Please see the history of present illness.    Review of Systems  Constitutional: Negative.   Respiratory: Negative.    Cardiovascular: Negative.   Neurological: Negative.   Psychiatric/Behavioral: Negative.      All other systems reviewed and are negative.   Labs/Other Tests and Data Reviewed:    Recent Labs: 01/04/2022: TSH 1.340 05/23/2022: ALT 21; Hemoglobin 13.8; Platelet Count 235 09/07/2022: BUN 23; Creatinine, Ser 1.03; Potassium 4.3; Sodium 140   Recent Lipid Panel Lab Results  Component Value Date/Time   CHOL 187 01/21/2020 12:59 PM   TRIG 73 01/21/2020 12:59 PM   HDL 64 01/21/2020 12:59 PM   CHOLHDL 2.9 01/21/2020 12:59 PM   LDLCALC 110 (H) 01/21/2020 12:59 PM    Wt Readings from Last 3 Encounters:  10/31/22 180 lb 12.8 oz (82 kg)  09/07/22 187 lb (84.8 kg)  05/23/22 196 lb (88.9 kg)     Exam:    Vital Signs:  Temp (!) 101.5 F (38.6 C)     Physical Exam Vitals reviewed.  Constitutional:      General: She is not in acute distress.    Appearance: Normal appearance. She is obese.  Cardiovascular:     Rate and Rhythm: Normal rate and regular rhythm.     Pulses: Normal pulses.     Heart sounds: Normal heart sounds. No murmur heard. Pulmonary:     Effort: Pulmonary effort is normal. No respiratory distress.     Breath sounds: Normal breath sounds. No wheezing.  Neurological:     General: No focal deficit present.     Mental Status: She is alert and oriented to person, place, and time.     Cranial Nerves: No cranial nerve deficit.     Motor: No weakness.  Psychiatric:        Mood and Affect: Mood normal.        Behavior: Behavior normal.        Thought Content: Thought content normal.        Judgment: Judgment normal.     ASSESSMENT & PLAN:    Positive self-administered antigen test for  COVID-19 Assessment & Plan: Advised patient to take Vitamin C, D, Zinc.  Keep yourself hydrated with a lot of water and rest. Take Delsym for cough and Mucinex as need. Take Tylenol or pain reliever every 4-6 hours as needed for pain/fever/body ache. If you have elevated blood pressure, you can take OTC Corcidin. You can also take OTC oscillococcinum to help with your symptoms.  Educated patient if symptoms get worse or if she experiences any SOB, chest pain or pain in her legs to seek immediate emergency care.  Continue to monitor your oxygen levels. Call us if you have any questions. Quarantine for 5 days and wear for additional 5 days after quarantine.   Orders: -     nirmatrelvir/ritonavir; Take 3 tablets by mouth 2 (two) times daily for 5 days. Patient GFR is 62. Take nirmatrelvir (150 mg) two tablets twice daily for 5 days and ritonavir (100 mg) one tablet twice daily for 5 days.  Dispense: 30 tablet; Refill: 0      COVID-19 Education: The signs and symptoms of COVID-19 were discussed with the patient and how to seek care for testing (follow up with PCP or arrange E-visit).  The importance of social distancing was discussed today.  Patient Risk:   After full review of this patients clinical status, I feel that they are at least moderate risk at this time.  Time:   Today, I have spent 8 minutes/ seconds with the patient with telehealth technology discussing above diagnoses.     Medication Adjustments/Labs and Tests Ordered: Current medicines are reviewed at length with the patient today.  Concerns regarding medicines are outlined above.   Tests Ordered: No orders of the defined types were placed in this encounter.   Medication Changes: Meds ordered this encounter  Medications   nirmatrelvir/ritonavir (PAXLOVID) 20 x 150 MG & 10 x 100MG  TABS    Sig: Take 3 tablets by mouth 2 (two) times daily for 5 days. Patient GFR is 62. Take nirmatrelvir (150 mg) two tablets twice daily for 5  days and ritonavir (100 mg) one tablet twice daily for 5 days.    Dispense:  30 tablet    Refill:  0    Disposition:  Follow up prn  Signed, Arnette Felts, FNP

## 2022-12-28 ENCOUNTER — Telehealth: Payer: Self-pay | Admitting: *Deleted

## 2022-12-28 ENCOUNTER — Other Ambulatory Visit: Payer: Self-pay

## 2022-12-28 DIAGNOSIS — U071 COVID-19: Secondary | ICD-10-CM

## 2022-12-28 MED ORDER — NIRMATRELVIR/RITONAVIR (PAXLOVID)TABLET
3.0000 | ORAL_TABLET | Freq: Two times a day (BID) | ORAL | 0 refills | Status: AC
Start: 2022-12-28 — End: 2023-01-02

## 2022-12-28 NOTE — Telephone Encounter (Signed)
Notified case still pending, they needed last 6 months of weights. Patient voiced understanding.

## 2023-01-02 ENCOUNTER — Encounter

## 2023-03-19 ENCOUNTER — Other Ambulatory Visit: Payer: Self-pay

## 2023-03-19 DIAGNOSIS — I10 Essential (primary) hypertension: Secondary | ICD-10-CM

## 2023-03-19 MED ORDER — LISINOPRIL-HYDROCHLOROTHIAZIDE 10-12.5 MG PO TABS
1.0000 | ORAL_TABLET | Freq: Every day | ORAL | 0 refills | Status: DC
Start: 2023-03-19 — End: 2023-04-17

## 2023-03-21 ENCOUNTER — Ambulatory Visit (INDEPENDENT_AMBULATORY_CARE_PROVIDER_SITE_OTHER): Admitting: Student

## 2023-03-21 ENCOUNTER — Encounter: Payer: Self-pay | Admitting: Student

## 2023-03-21 VITALS — BP 104/70 | HR 65 | Ht 65.0 in | Wt 160.0 lb

## 2023-03-21 DIAGNOSIS — M793 Panniculitis, unspecified: Secondary | ICD-10-CM

## 2023-03-21 MED ORDER — OXYCODONE HCL 5 MG PO TABS: 5.0000 mg | ORAL_TABLET | Freq: Four times a day (QID) | ORAL | 0 refills | Status: DC | PRN

## 2023-03-21 MED ORDER — ONDANSETRON HCL 4 MG PO TABS: 4.0000 mg | ORAL_TABLET | Freq: Three times a day (TID) | ORAL | 0 refills | Status: DC | PRN

## 2023-03-21 NOTE — Progress Notes (Signed)
Patient ID: Cindy Cain, female    DOB: Mar 24, 1961, 62 y.o.   MRN: [id number]  Chief Complaint  Patient presents with   Pre-op Exam      ICD-10-CM   1. Panniculitis  M79.3        History of Present Illness: Cindy Cain is a 62 y.o.  female  with a history of panniculitis.  She presents for preoperative evaluation for upcoming procedure, panniculectomy with upper abdominal add-on, scheduled for 04/16/23 with Dr. Ladona Ridgel.  The patient has not had problems with anesthesia.  She denies any history of cardiac disease.  She denies taking any blood thinners.  She does report history of iron deficiency anemia, but states that has been controlled and has not needed an infusion in 2 years.  Per chart review, her most recent hemoglobin was 13.8 back in January 2024.  Patient reports she is not a smoker.  Patient denies taking any birth control or hormone replacement.  She denies any history of miscarriages.  She denies any personal or family history of blood clots or clotting diseases.  She denies any recent traumas, surgeries, infections.  She does state that she had a tooth pulled about 2 months ago, denies any issues since then.  Patient states that she had a TIA in 2013, denies any issues since then.  Denies taking any blood thinners at this time.  She denies any history of heart attack.  She denies any history of Crohn's disease or ulcerative colitis.  She denies any history of COPD or asthma.  She denies any history of cancer.  She denies any varicosities to her lower extremities.  She denies any fevers or chills or changes in her health.  Patient reports she is lost 20 pounds since she was last seen here.  Updated photos placed on the chart with patient's permission.  She did not feel there were any significant changes to her abdomen with the weight loss.  Summary of Previous Visit: Patient saw Dr. Ladona Ridgel for initial consult on 10/31/2022.  At this visit, patient was complaining of excess  skin on her anterior abdominal wall which caused rashes on the posterior aspect of the skin in the intertriginous regions.  Patient had also underwent a gastric sleeve in 2014 and lost approximately 90 pounds.  Job: Does not work at this time  PMH Significant for: Hypertension, history of bariatric surgery, iron deficiency anemia   Past Medical History: Allergies: Allergies  Allergen Reactions   Aspirin Other (See Comments)    Ulcers    Codeine Other (See Comments)    ulcers    Current Medications:  Current Outpatient Medications:    acetaminophen (TYLENOL) 325 MG tablet, Take by mouth., Disp: , Rfl:    CALCIUM PO, Take 1 tablet by mouth daily., Disp: , Rfl:    Cholecalciferol (VITAMIN D3) 125 MCG (5000 UT) CAPS, Take by mouth., Disp: , Rfl:    docusate sodium (COLACE) 100 MG capsule, Take by mouth., Disp: , Rfl:    lisinopril-hydrochlorothiazide (ZESTORETIC) 10-12.5 MG tablet, Take 1 tablet by mouth daily., Disp: 30 tablet, Rfl: 0   loratadine (CLARITIN) 10 MG tablet, Take 1 tablet (10 mg total) by mouth daily., Disp: 90 tablet, Rfl: 1   magnesium oxide (MAG-OX) 400 MG tablet, Take 400 mg by mouth daily., Disp: , Rfl:    meclizine (ANTIVERT) 12.5 MG tablet, Take 1 tablet (12.5 mg total) by mouth 3 (three) times daily as needed for dizziness., Disp: 30  tablet, Rfl: 2   Multiple Vitamins-Minerals (MULTIVITAMIN WITH MINERALS) tablet, Take 1 tablet by mouth daily., Disp: , Rfl:    Thiamine HCl (VITAMIN B-1) 250 MG tablet, Take by mouth., Disp: , Rfl:   Past Medical Problems: Past Medical History:  Diagnosis Date   Hypertension     Past Surgical History: Past Surgical History:  Procedure Laterality Date   ABDOMINAL HYSTERECTOMY     partial, secondary to bleeding and anemia   APPENDECTOMY     CHOLECYSTECTOMY     GASTRIC BYPASS      Social History: Social History   Socioeconomic History   Marital status: Married    Spouse name: Not on file   Number of children: Not on  file   Years of education: Not on file   Highest education level: Not on file  Occupational History   Not on file  Tobacco Use   Smoking status: Never   Smokeless tobacco: Never  Vaping Use   Vaping status: Never Used  Substance and Sexual Activity   Alcohol use: No   Drug use: No   Sexual activity: Not on file  Other Topics Concern   Not on file  Social History Narrative   Not on file   Social Determinants of Health   Financial Resource Strain: Not on file  Food Insecurity: Not on file  Transportation Needs: Not on file  Physical Activity: Not on file  Stress: Not on file  Social Connections: Unknown (09/18/2021)   Received from [facility], [facility]   Social Network    Social Network: Not on file  Intimate Partner Violence: Unknown (08/10/2021)   Received from [employer], [facility]   HITS    Physically Hurt: Not on file    Insult or Talk Down To: Not on file    Threaten Physical Harm: Not on file    Scream or Curse: Not on file    Family History: Family History  Problem Relation Age of Onset   COPD Mother    Hemolytic uremic syndrome Father    Heart attack Brother    Diabetes Brother    Hypertension Brother    Allergies Son     Review of Systems: Denies any fevers, chills or recent changes in her health  Physical Exam: Vital Signs BP 104/70 (BP Location: Left Arm, Patient Position: Sitting, Cuff Size: Large)   Pulse 65   Ht 5\' 5"  (1.651 m)   Wt 160 lb (72.6 kg)   SpO2 99%   BMI 26.63 kg/m   Physical Exam  Constitutional:      General: Not in acute distress.    Appearance: Normal appearance. Not ill-appearing.  HENT:     Head: Normocephalic and atraumatic.  Neck:     Musculoskeletal: Normal range of motion.  Cardiovascular:     Rate and Rhythm: Normal rate Pulmonary:     Effort: Pulmonary effort is normal. No respiratory distress.  Musculoskeletal: Normal range of motion.  Skin:    General: Skin is warm and dry.     Findings:  No erythema or rash.  Neurological:     Mental Status: Alert and oriented to person, place, and time. Mental status is at baseline.  Psychiatric:        Mood and Affect: Mood normal.        Behavior: Behavior normal.    Assessment/Plan: The patient is scheduled for panniculectomy with upper abdominal add-on with Dr. Ladona Ridgel.  Risks, benefits, and alternatives of procedure  discussed, questions answered and consent obtained.    Smoking Status: Non-smoker; Counseling Given?  N/A  Caprini Score: 5; Risk Factors include: Age, BMI > 25, and length of planned surgery. Recommendation for mechanical prophylaxis. Encourage early ambulation.   Will send clearance to patient's PCP.  Pictures obtained: @consult , new photos obtained today.  Will discuss weight loss with Dr. Ladona Ridgel.  Discussed with patient there is a possibility she may need to be reevaluated by Dr. Ladona Ridgel.  Patient expressed understanding.  Post-op Rx sent to pharmacy: Oxycodone, Zofran  Instructed patient to hold her multivitamins and supplements at least 1 week prior to surgery.  Discussed with her to hold her lisinopril-HCTZ the day of surgery.  Patient expressed understanding.  Patient was provided with the General Surgical Risk consent document and Pain Medication Agreement prior to their appointment.  They had adequate time to read through the risk consent documents and Pain Medication Agreement. We also discussed them in person together during this preop appointment. All of their questions were answered to their satisfaction.  Recommended calling if they have any further questions.  Risk consent form and Pain Medication Agreement to be scanned into patient's chart.  The consent was obtained with risks and complications reviewed which included bleeding, pain, scar, infection and the risk of anesthesia.  The patients questions were answered to the patients expressed satisfaction.    Electronically signed by: Laurena Spies, PA-C  03/21/2023 9:39 AM

## 2023-03-26 NOTE — Progress Notes (Signed)
Madelaine Bhat, CMA,acting as a Neurosurgeon for Arnette Felts, FNP.,have documented all relevant documentation on the behalf of Arnette Felts, FNP,as directed by  Arnette Felts, FNP while in the presence of Arnette Felts, FNP.  Subjective:  Patient ID: Cindy Cain , female    DOB: Apr 08, 1961 , 62 y.o.   MRN: [id number]  Chief Complaint  Patient presents with   Hypertension   Pre-op Exam    HPI  Patient presents today for a bp and chol follow up, Patient reports compliance with medication. Patient denies any chest pain, SOB, or headaches. Patient has no concerns today. Patient is also getting medical clearance. She is to have Panniculectomy in December. She is exercising 5 days a week with strength.  She has been dizzy and lightheaded today, she feels is her vertigo. She took dramamine.      Past Medical History:  Diagnosis Date   Hypertension      Family History  Problem Relation Age of Onset   COPD Mother    Hemolytic uremic syndrome Father    Heart attack Brother    Diabetes Brother    Hypertension Brother    Allergies Son      Current Outpatient Medications:    acetaminophen (TYLENOL) 325 MG tablet, Take by mouth., Disp: , Rfl:    CALCIUM PO, Take 1 tablet by mouth daily., Disp: , Rfl:    Cholecalciferol (VITAMIN D3) 125 MCG (5000 UT) CAPS, Take by mouth., Disp: , Rfl:    docusate sodium (COLACE) 100 MG capsule, Take by mouth., Disp: , Rfl:    lisinopril-hydrochlorothiazide (ZESTORETIC) 10-12.5 MG tablet, Take 1 tablet by mouth daily., Disp: 30 tablet, Rfl: 0   loratadine (CLARITIN) 10 MG tablet, Take 1 tablet (10 mg total) by mouth daily., Disp: 90 tablet, Rfl: 1   magnesium oxide (MAG-OX) 400 MG tablet, Take 400 mg by mouth daily., Disp: , Rfl:    meclizine (ANTIVERT) 12.5 MG tablet, Take 1 tablet (12.5 mg total) by mouth 3 (three) times daily as needed for dizziness., Disp: 30 tablet, Rfl: 2   Multiple Vitamins-Minerals (MULTIVITAMIN WITH MINERALS) tablet, Take 1 tablet by  mouth daily., Disp: , Rfl:    ondansetron (ZOFRAN) 4 MG tablet, Take 1 tablet (4 mg total) by mouth every 8 (eight) hours as needed for up to 20 doses for nausea or vomiting., Disp: 20 tablet, Rfl: 0   oxyCODONE (ROXICODONE) 5 MG immediate release tablet, Take 1 tablet (5 mg total) by mouth every 6 (six) hours as needed for up to 20 doses for severe pain (pain score 7-10)., Disp: 20 tablet, Rfl: 0   Thiamine HCl (VITAMIN B-1) 250 MG tablet, Take by mouth., Disp: , Rfl:    Allergies  Allergen Reactions   Aspirin Other (See Comments)    Ulcers    Codeine Other (See Comments)    ulcers     Review of Systems  Constitutional: Negative.   Gastrointestinal:  Positive for constipation.  Neurological:  Positive for dizziness and light-headedness. Negative for weakness.     Today's Vitals   03/27/23 1120  BP: 132/68  Pulse: (!) 47  Temp: 98.1 F (36.7 C)  TempSrc: Oral  Weight: 162 lb 12.8 oz (73.8 kg)  Height: 5\' 5"  (1.651 m)  PainSc: 0-No pain   Body mass index is 27.09 kg/m.  Wt Readings from Last 3 Encounters:  03/27/23 162 lb 12.8 oz (73.8 kg)  03/21/23 160 lb (72.6 kg)  10/31/22 180 lb 12.8 oz (82  kg)      Objective:  Physical Exam Vitals reviewed. Exam conducted with a chaperone present.  Constitutional:      General: She is not in acute distress.    Appearance: Normal appearance. She is well-developed and normal weight.  HENT:     Head: Normocephalic and atraumatic.  Cardiovascular:     Rate and Rhythm: Normal rate and regular rhythm.     Pulses: Normal pulses.     Heart sounds: Normal heart sounds. No murmur heard. Pulmonary:     Effort: Pulmonary effort is normal. No respiratory distress.     Breath sounds: Normal breath sounds. No wheezing.  Genitourinary:    General: Normal vulva.     Exam position: Lithotomy position.     Tanner stage (genital): 5.     Vagina: Normal.     Cervix: Normal.     Uterus: Normal.      Adnexa: Right adnexa normal and left  adnexa normal.     Rectum: Normal. Guaiac result positive.  Musculoskeletal:        General: Normal range of motion.  Skin:    General: Skin is warm and dry.     Capillary Refill: Capillary refill takes less than 2 seconds.  Neurological:     General: No focal deficit present.     Mental Status: She is alert and oriented to person, place, and time.     Cranial Nerves: No cranial nerve deficit.     Motor: No weakness.  Psychiatric:        Mood and Affect: Mood normal.        Behavior: Behavior normal.        Thought Content: Thought content normal.        Judgment: Judgment normal.         Assessment And Plan:  Essential hypertension Assessment & Plan: Blood pressure is controlled.  Continue current medications.  Orders: -     BMP8+eGFR  Mixed hyperlipidemia Assessment & Plan: Cholesterol levels have not been checked in several years will check today.  Advised to focus on low-fat diet.  Orders: -     Lipid panel  Pre-op evaluation Assessment & Plan: She is cleared medically with minimal risk.  She needs to be cleared cardiac due to bradycardia this is a new finding.  She is also having some dizziness  Orders: -     EKG 12-Lead -     Ambulatory referral to Cardiology  COVID-19 vaccine administered Assessment & Plan: Covid 19 vaccine given in office observed for 15 minutes without any adverse reaction   Orders: Best boy Vaccine 27yrs & older  Need for influenza vaccination Assessment & Plan: Influenza vaccine administered Encouraged to take Tylenol as needed for fever or muscle aches.   Orders: -     Flu vaccine trivalent PF, 6mos and older(Flulaval,Afluria,Fluarix,Fluzone)  Herpes zoster vaccination declined Assessment & Plan: Declines shingrix, educated on disease process and is aware if he changes his mind to notify office    Tetanus, diphtheria, and acellular pertussis (Tdap) vaccination declined  Encounter for Papanicolaou  smear of cervix -     Cytology - PAP  Bradycardia Assessment & Plan: EKG shows bradycardia heart rate 47  Orders: -     Ambulatory referral to Cardiology -     TSH  Dizziness Assessment & Plan: Heart rate is low at 47 this is new will refer to cardiology as she is trying to  get cleared for surgery  Orders: -     CBC with Differential/Platelet  Occult blood positive stool Assessment & Plan: She has a positive occult blood in her stool.  I have advised her to do the 3-day stool card if has blood in her stool will refer to GI for further evaluation.  She reports she has been having some constipation   Chronic idiopathic constipation Assessment & Plan: Advised to take probiotic and to consider taking a stool softener.  This may be causing her to have blood in her stools.  She is to stay well-hydrated with water as well.     Return for 6 month bp check.  Patient was given opportunity to ask questions. Patient verbalized understanding of the plan and was able to repeat key elements of the plan. All questions were answered to their satisfaction.    Jeanell Sparrow, FNP, have reviewed all documentation for this visit. The documentation on 04/01/23 for the exam, diagnosis, procedures, and orders are all accurate and complete.   IF YOU HAVE BEEN REFERRED TO A SPECIALIST, IT MAY TAKE 1-2 WEEKS TO SCHEDULE/PROCESS THE REFERRAL. IF YOU HAVE NOT HEARD FROM US/SPECIALIST IN TWO WEEKS, PLEASE GIVE [place] A CALL AT [phone].

## 2023-03-27 ENCOUNTER — Encounter: Payer: Self-pay | Admitting: Hematology

## 2023-03-27 ENCOUNTER — Encounter: Payer: Self-pay | Admitting: Nurse Practitioner

## 2023-03-27 ENCOUNTER — Other Ambulatory Visit (HOSPITAL_COMMUNITY)
Admission: RE | Admit: 2023-03-27 | Discharge: 2023-03-27 | Disposition: A | Discharge status: Home / Self Care | Source: Ambulatory Visit | Attending: Nurse Practitioner | Admitting: Nurse Practitioner

## 2023-03-27 ENCOUNTER — Ambulatory Visit (INDEPENDENT_AMBULATORY_CARE_PROVIDER_SITE_OTHER): Admitting: Nurse Practitioner

## 2023-03-27 VITALS — BP 132/68 | HR 47 | Temp 98.1°F | Ht 65.0 in | Wt 162.8 lb

## 2023-03-27 DIAGNOSIS — Z124 Encounter for screening for malignant neoplasm of cervix: Secondary | ICD-10-CM | POA: Insufficient documentation

## 2023-03-27 DIAGNOSIS — Z23 Encounter for immunization: Secondary | ICD-10-CM | POA: Diagnosis not present

## 2023-03-27 DIAGNOSIS — R42 Dizziness and giddiness: Secondary | ICD-10-CM | POA: Insufficient documentation

## 2023-03-27 DIAGNOSIS — Z01818 Encounter for other preprocedural examination: Secondary | ICD-10-CM

## 2023-03-27 DIAGNOSIS — R195 Other fecal abnormalities: Secondary | ICD-10-CM

## 2023-03-27 DIAGNOSIS — R001 Bradycardia, unspecified: Secondary | ICD-10-CM

## 2023-03-27 DIAGNOSIS — Z2821 Immunization not carried out because of patient refusal: Secondary | ICD-10-CM

## 2023-03-27 DIAGNOSIS — I10 Essential (primary) hypertension: Secondary | ICD-10-CM

## 2023-03-27 DIAGNOSIS — K5904 Chronic idiopathic constipation: Secondary | ICD-10-CM

## 2023-03-27 DIAGNOSIS — E782 Mixed hyperlipidemia: Secondary | ICD-10-CM | POA: Diagnosis not present

## 2023-03-27 NOTE — Patient Instructions (Addendum)
You will need to do the stool cards with a stool on different days, bring back to the office with your name and date.

## 2023-03-28 LAB — CBC WITH DIFFERENTIAL/PLATELET
Basophils Absolute: 0 10*3/uL (ref 0.0–0.2)
Basos: 1 %
EOS (ABSOLUTE): 0.1 10*3/uL (ref 0.0–0.4)
Eos: 4 %
Hematocrit: 37.7 % (ref 34.0–46.6)
Hemoglobin: 12 g/dL (ref 11.1–15.9)
Immature Grans (Abs): 0 10*3/uL (ref 0.0–0.1)
Immature Granulocytes: 0 %
Lymphocytes Absolute: 2 10*3/uL (ref 0.7–3.1)
Lymphs: 57 %
MCH: 28.7 pg (ref 26.6–33.0)
MCHC: 31.8 g/dL (ref 31.5–35.7)
MCV: 90 fL (ref 79–97)
Monocytes Absolute: 0.3 10*3/uL (ref 0.1–0.9)
Monocytes: 7 %
Neutrophils Absolute: 1.1 10*3/uL — ABNORMAL LOW (ref 1.4–7.0)
Neutrophils: 31 %
Platelets: 223 10*3/uL (ref 150–450)
RBC: 4.18 x10E6/uL (ref 3.77–5.28)
RDW: 11.4 % — ABNORMAL LOW (ref 11.7–15.4)
WBC: 3.5 10*3/uL (ref 3.4–10.8)

## 2023-03-28 LAB — BMP8+EGFR
BUN/Creatinine Ratio: 19 (ref 12–28)
BUN: 17 mg/dL (ref 8–27)
CO2: 27 mmol/L (ref 20–29)
Calcium: 9.2 mg/dL (ref 8.7–10.3)
Chloride: 101 mmol/L (ref 96–106)
Creatinine, Ser: 0.88 mg/dL (ref 0.57–1.00)
Glucose: 79 mg/dL (ref 70–99)
Potassium: 4 mmol/L (ref 3.5–5.2)
Sodium: 139 mmol/L (ref 134–144)
eGFR: 75 mL/min/{1.73_m2} (ref >59)

## 2023-03-28 LAB — LIPID PANEL
Chol/HDL Ratio: 2.8 ratio (ref 0.0–4.4)
Cholesterol, Total: 154 mg/dL (ref 100–199)
HDL: 55 mg/dL (ref >39)
LDL Chol Calc (NIH): 89 mg/dL (ref 0–99)
Triglycerides: 46 mg/dL (ref 0–149)
VLDL Cholesterol Cal: 10 mg/dL (ref 5–40)

## 2023-03-28 LAB — TSH: TSH: 1.33 u[IU]/mL (ref 0.450–4.500)

## 2023-04-01 ENCOUNTER — Encounter: Payer: Self-pay | Admitting: Nurse Practitioner

## 2023-04-01 NOTE — Assessment & Plan Note (Signed)
Cholesterol levels have not been checked in several years will check today.  Advised to focus on low-fat diet.

## 2023-04-01 NOTE — Assessment & Plan Note (Signed)
Covid 19 vaccine given in office observed for 15 minutes without any adverse reaction  

## 2023-04-01 NOTE — Assessment & Plan Note (Signed)
Heart rate is low at 47 this is new will refer to cardiology as she is trying to get cleared for surgery

## 2023-04-01 NOTE — Assessment & Plan Note (Signed)
She has a positive occult blood in her stool.  I have advised her to do the 3-day stool card if has blood in her stool will refer to GI for further evaluation.  She reports she has been having some constipation

## 2023-04-01 NOTE — Assessment & Plan Note (Signed)
Influenza vaccine administered Encouraged to take Tylenol as needed for fever or muscle aches.

## 2023-04-01 NOTE — Assessment & Plan Note (Signed)
Declines shingrix, educated on disease process and is aware if he changes his mind to notify office  

## 2023-04-01 NOTE — Assessment & Plan Note (Signed)
She is cleared medically with minimal risk.  She needs to be cleared cardiac due to bradycardia this is a new finding.  She is also having some dizziness

## 2023-04-01 NOTE — Assessment & Plan Note (Signed)
Blood pressure is controlled. Continue current medications.

## 2023-04-01 NOTE — Assessment & Plan Note (Signed)
EKG shows bradycardia heart rate 47

## 2023-04-01 NOTE — Assessment & Plan Note (Signed)
Advised to take probiotic and to consider taking a stool softener.  This may be causing her to have blood in her stools.  She is to stay well-hydrated with water as well.

## 2023-04-02 LAB — CYTOLOGY - PAP
Comment: NEGATIVE
Diagnosis: NEGATIVE
High risk HPV: NEGATIVE

## 2023-04-03 ENCOUNTER — Encounter: Payer: Self-pay | Admitting: Nurse Practitioner

## 2023-04-03 ENCOUNTER — Encounter: Payer: Self-pay | Admitting: Student

## 2023-04-03 NOTE — Progress Notes (Signed)
Surgical Clearance has been received from patient's PCP, Arnette Felts FNP, for patient's upcoming surgery with Dr. Ladona Ridgel .  Per Arnette Felts FNP, "patient was referred to cardiology due to bradycardia. Cleared medically but not cardiac".   Called and spoke with patient in regards to clearance received by her PCP.  Discussed with her that she is going to have to get in with cardiology prior to surgery.  Patient states that she believes her bradycardia was due to Dramamine she was taking at the time of her PCP appointment.  She states that she has been monitoring her heart rate with pulse oximetry and her heart rate has been in at least the 60s.  She states that that she is seeing her PCP again today and understands that she will need to see cardiology prior to surgery.

## 2023-04-09 ENCOUNTER — Other Ambulatory Visit (INDEPENDENT_AMBULATORY_CARE_PROVIDER_SITE_OTHER): Admitting: Nurse Practitioner

## 2023-04-09 DIAGNOSIS — R195 Other fecal abnormalities: Secondary | ICD-10-CM | POA: Diagnosis not present

## 2023-04-09 DIAGNOSIS — K5904 Chronic idiopathic constipation: Secondary | ICD-10-CM

## 2023-04-09 LAB — HEMOCCULT GUIAC POC 1CARD (OFFICE)
Card #2 Fecal Occult Blod, POC: NEGATIVE
Card #3 Fecal Occult Blood, POC: NEGATIVE
Fecal Occult Blood, POC: NEGATIVE

## 2023-04-11 ENCOUNTER — Encounter: Payer: Self-pay | Admitting: Nurse Practitioner

## 2023-04-12 ENCOUNTER — Telehealth: Payer: Self-pay | Admitting: *Deleted

## 2023-04-12 ENCOUNTER — Telehealth: Payer: Self-pay | Admitting: Student

## 2023-04-12 NOTE — Telephone Encounter (Signed)
Patient called and stated that she has a cardiology appointment for Monday.  I will go ahead and send clearance to the cardiologist.  Discussed with the patient that once we get the clearance, we can go ahead and reschedule her surgery.  Patient expressed understanding was in agreement the plan.

## 2023-04-12 NOTE — Telephone Encounter (Signed)
Patient says that appointment is already set up and just need the clearance.

## 2023-04-12 NOTE — Telephone Encounter (Signed)
   Pre-operative Risk Assessment    Patient Name: Cindy Cain  DOB: [birth year] MRN: [id number]  DATE OF LAST VISIT: NONE DATE OF NEXT VISIT: 04/16/23 NEW PT APPT WITH DR. O'NEAL    Request for Surgical Clearance    Procedure:   PANNICULECTOMY   Date of Surgery:  Clearance TBD                                 Surgeon: NOT LISTED Surgeon's Group or Practice Name:  [facility] Phone number:  [phone] Fax number:  [phone]   Type of Clearance Requested:   - Medical ; NONE INDICATED TO BE HELD   Type of Anesthesia:  Not Indicated   Additional requests/questions:    Elpidio Anis   04/12/2023, 5:29 PM

## 2023-04-13 NOTE — Telephone Encounter (Signed)
Pt has already been scheduled to see Dr. Flora Lipps, 04/16/23, clearance will be addressed at that time.  Will route back to requesting surgeon's office to make them aware.

## 2023-04-13 NOTE — Telephone Encounter (Signed)
    Primary Cardiologist:None  Chart reviewed as part of pre-operative protocol coverage. Because of Cindy Cain's past medical history and time since last visit, he/she will require a follow-up visit in order to better assess preoperative cardiovascular risk.  Pre-op covering staff: - Please schedule office appointment and call patient to inform them. - Please contact requesting surgeon's office via preferred method (i.e, phone, fax) to inform them of need for appointment prior to surgery.  If applicable, this message will also be routed to pharmacy pool and/or primary cardiologist for input on holding anticoagulant/antiplatelet agent as requested below so that this information is available at time of patient's appointment.   Ronney Asters, NP  04/13/2023, 8:12 AM

## 2023-04-15 NOTE — Progress Notes (Unsigned)
  Cardiology Office Note:  .   Date:  04/16/2023  ID:  [name], DOB [birth year], MRN [id number] PCP: Arnette Felts, FNP  [facility] Providers Cardiologist:  Reatha Harps, MD { History of Present Illness: .   Cindy Cain is a 62 y.o. female with history of HTN, HLD who presents for the evaluation of bradycardia/preop at the request of Arnette Felts, FNP.  History of Present Illness   Cindy Cain, a 62 year old with a history of hypertension and hyperlipidemia, presents for evaluation of bradycardia and preoperative assessment. She is scheduled for a panniculectomy due to abdominal pain. Her EKG shows sinus bradycardia with a heart rate of 52, but no acute ischemic changes or evidence of infarction. Her TSH is 1.3. She has a history of a TIA approximately 11-12 years ago, but no other known heart disease. She has had previous surgeries including gallbladder removal, appendectomy, and gastric bypass, all without complications. 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 She exercises regularly, doing cardio five times a week for 45 minutes each session, and strength training three times a week. She denies chest pain, trouble breathing, and dizziness during these sessions. She also reports drinking caffeinated beverages in moderation. No family history of heart disease.       TSH 1.3     Problem List HTN HLD -T chol 154, HDL 55, LDL 89, TG 46    ROS: All other ROS reviewed and negative. Pertinent positives noted in the HPI.     Studies Reviewed: Marland Kitchen   EKG Interpretation Date/Time:  Monday April 16 2023 14:55:40 EST Ventricular Rate:  52 PR Interval:  160 QRS Duration:  82 QT Interval:  422 QTC Calculation: 392 R Axis:   57  Text Interpretation: Sinus bradycardia Confirmed by Lennie Odor [id number]) on 04/16/2023 2:57:39 PM   Physical Exam:   VS:  BP 118/74 (BP Location: Left Arm, Patient Position: Sitting, Cuff Size: Normal)   Pulse (!)  52   Ht 5\' 4"  (1.626 m)   Wt 165 lb 12.8 oz (75.2 kg)   SpO2 96%   BMI 28.46 kg/m    Wt Readings from Last 3 Encounters:  04/16/23 165 lb 12.8 oz (75.2 kg)  03/27/23 162 lb 12.8 oz (73.8 kg)  03/21/23 160 lb (72.6 kg)    GEN: Well nourished, well developed in no acute distress NECK: No JVD; No carotid bruits CARDIAC: RRR, no murmurs, rubs, gallops RESPIRATORY:  Clear to auscultation without rales, wheezing or rhonchi  ABDOMEN: Soft, non-tender, non-distended EXTREMITIES:  No edema; No deformity  ASSESSMENT AND PLAN: .   Assessment and Plan    Sinus Bradycardia Likely secondary to high level of physical fitness. No symptoms of chest pain or trouble breathing during high level of activity. No medications contributing to bradycardia. TSH normal. -No further intervention required.  Preoperative Assessment for Panniculectomy Patient can complete greater than 4 METs. RCI equals 0, indicating ver low risk of adverse cardiovascular event (0.4% risk) in perioperative period. -she may proceed to surgery without further testing              Follow-up: Return if symptoms worsen or fail to improve.  Signed, [name]. Flora Lipps, MD, [facility]  [facility]  [address] [place], [place] [address] [phone]  3:08 PM

## 2023-04-16 ENCOUNTER — Other Ambulatory Visit: Payer: Self-pay | Admitting: Nurse Practitioner

## 2023-04-16 ENCOUNTER — Encounter: Payer: Self-pay | Admitting: Cardiovascular Disease

## 2023-04-16 ENCOUNTER — Ambulatory Visit: Attending: Cardiovascular Disease | Admitting: Cardiovascular Disease

## 2023-04-16 VITALS — BP 118/74 | HR 52 | Ht 64.0 in | Wt 165.8 lb

## 2023-04-16 DIAGNOSIS — Z01810 Encounter for preprocedural cardiovascular examination: Secondary | ICD-10-CM | POA: Diagnosis not present

## 2023-04-16 DIAGNOSIS — I10 Essential (primary) hypertension: Secondary | ICD-10-CM

## 2023-04-16 DIAGNOSIS — R001 Bradycardia, unspecified: Secondary | ICD-10-CM

## 2023-04-16 NOTE — Patient Instructions (Addendum)
Medication Instructions:  Your physician recommends that you continue on your current medications as directed. Please refer to the Current Medication list given to you today.    *If you need a refill on your cardiac medications before your next appointment, please call your pharmacy*   Lab Work: None    If you have labs (blood work) drawn today and your tests are completely normal, you will receive your results only by: MyChart Message (if you have MyChart) OR A paper copy in the mail If you have any lab test that is abnormal or we need to change your treatment, we will call you to review the results.   Testing/Procedures: None    Follow-Up: At [facility], you and your health needs are our priority.  As part of our continuing mission to provide you with exceptional heart care, we have created designated Provider Care Teams.  These Care Teams include your primary Cardiologist (physician) and Advanced Practice Providers (APPs -  Physician Assistants and Nurse Practitioners) who all work together to provide you with the care you need, when you need it.  We recommend signing up for the patient portal called "MyChart".  Sign up information is provided on this After Visit Summary.  MyChart is used to connect with patients for Virtual Visits (Telemedicine).  Patients are able to view lab/test results, encounter notes, upcoming appointments, etc.  Non-urgent messages can be sent to your provider as well.   To learn more about what you can do with MyChart, go to [email or web address].    Your next appointment:   Follow up as needed     Provider:   Reatha Harps, MD    Other Instructions

## 2023-04-23 NOTE — Telephone Encounter (Signed)
Good Morning,   Patient's surgery was recently cancelled due to needing cardiac clearance. There is now clearance in patient's chart from cardiology that she is able to move forward with surgery. Can you please schedule her for surgery now that she is cleared by cardiology?  Thanks, Alan Ripper

## 2023-04-25 ENCOUNTER — Encounter: Admitting: Plastic Surgery

## 2023-05-07 ENCOUNTER — Encounter: Admitting: Student

## 2023-05-15 ENCOUNTER — Encounter: Payer: Self-pay | Admitting: Hematology

## 2023-05-22 ENCOUNTER — Encounter: Payer: Self-pay | Admitting: Surgical

## 2023-05-22 ENCOUNTER — Ambulatory Visit (INDEPENDENT_AMBULATORY_CARE_PROVIDER_SITE_OTHER): Payer: Self-pay | Admitting: Surgical

## 2023-05-22 VITALS — BP 124/86 | HR 57

## 2023-05-22 DIAGNOSIS — M793 Panniculitis, unspecified: Secondary | ICD-10-CM

## 2023-05-22 NOTE — Progress Notes (Signed)
 Patient ID: Cindy Cain, female    DOB: Feb 25, 1961, 63 y.o.   MRN: [id number]  Chief Complaint  Patient presents with   Pre-op Exam      ICD-10-CM   1. Panniculitis  M79.3       History of Present Illness: Cindy Cain is a 63 y.o.  female  with a history of excess abdominal skin/panniculitis.  She presents for preoperative evaluation for upcoming procedure, panniculectomy with upper abdominoplasty add-on, scheduled for 06/11/2023 with Dr. Waddell.  The patient has not had problems with anesthesia. No history of DVT/PE.  No family history of DVT/PE.  No family or personal history of bleeding or clotting disorders.  Patient is not currently taking any blood thinners.  No history of CVA/MI.   Patient is a non-smoker.  She does have a history of iron deficiency anemia, most recent hemoglobin 12 a few months ago.  She reports no recent changes to her health, no changes to her health since her last preop.  Of note, she has received cardiac clearance which is documented in her chart from her visit on 04/16/2023.  Summary of Previous Visit: Patient has history of gastric sleeve in 2014.  She did have a previous preop appointment March 21, 2023, at that time she had lost additional 20 pounds.  New photos were obtained.  PMH Significant for: Hypertension, history of bariatric surgery, iron deficiency anemia.  She did have a previous preoperative appointment about 2 months ago, however her surgery was postponed due to need for cardiac clearance.  Cardiac clearance has been obtained.   Past Medical History: Allergies: Allergies  Allergen Reactions   Aspirin Other (See Comments)    Ulcers    Codeine Other (See Comments)    ulcers    Current Medications:  Current Outpatient Medications:    acetaminophen  (TYLENOL ) 325 MG tablet, Take 325 mg by mouth as needed., Disp: , Rfl:    CALCIUM PO, Take 1 tablet by mouth daily., Disp: , Rfl:    Cholecalciferol (VITAMIN D3) 125 MCG (5000  UT) CAPS, Take by mouth., Disp: , Rfl:    Cyanocobalamin  (VITAMIN B 12 PO), Take 1,000 mg by mouth daily., Disp: , Rfl:    docusate sodium (COLACE) 100 MG capsule, Take by mouth as needed., Disp: , Rfl:    lisinopril -hydrochlorothiazide  (ZESTORETIC ) 10-12.5 MG tablet, TAKE 1 TABLET BY MOUTH EVERY DAY, Disp: 90 tablet, Rfl: 2   loratadine  (CLARITIN ) 10 MG tablet, Take 1 tablet (10 mg total) by mouth daily. (Patient taking differently: Take 10 mg by mouth daily as needed.), Disp: 90 tablet, Rfl: 1   MAGNESIUM CHLORIDE PO, Take 1,000 mg by mouth daily., Disp: , Rfl:    magnesium gluconate (MAGONATE) 500 MG tablet, Take 500 mg by mouth daily., Disp: , Rfl:    magnesium oxide (MAG-OX) 400 MG tablet, Take 400 mg by mouth daily., Disp: , Rfl:    meclizine  (ANTIVERT ) 12.5 MG tablet, Take 1 tablet (12.5 mg total) by mouth 3 (three) times daily as needed for dizziness., Disp: 30 tablet, Rfl: 2   Multiple Vitamins-Minerals (MULTIVITAMIN WITH MINERALS) tablet, Take 1 tablet by mouth daily., Disp: , Rfl:    ondansetron  (ZOFRAN ) 4 MG tablet, Take 1 tablet (4 mg total) by mouth every 8 (eight) hours as needed for up to 20 doses for nausea or vomiting., Disp: 20 tablet, Rfl: 0   oxyCODONE  (ROXICODONE ) 5 MG immediate release tablet, Take 1 tablet (5 mg total) by mouth every  6 (six) hours as needed for up to 20 doses for severe pain (pain score 7-10)., Disp: 20 tablet, Rfl: 0   Thiamine HCl (VITAMIN B-1) 250 MG tablet, Take by mouth., Disp: , Rfl:   Past Medical Problems: Past Medical History:  Diagnosis Date   Hypertension     Past Surgical History: Past Surgical History:  Procedure Laterality Date   ABDOMINAL HYSTERECTOMY     partial, secondary to bleeding and anemia   APPENDECTOMY     CHOLECYSTECTOMY     GASTRIC BYPASS      Social History: Social History   Socioeconomic History   Marital status: Married    Spouse name: Not on file   Number of children: 2   Years of education: Not on file    Highest education level: Bachelor's degree (e.g., BA, AB, BS)  Occupational History   Occupation: Retired Orthoptist  Tobacco Use   Smoking status: Never   Smokeless tobacco: Never  Vaping Use   Vaping status: Never Used  Substance and Sexual Activity   Alcohol use: No   Drug use: No   Sexual activity: Not on file  Other Topics Concern   Not on file  Social History Narrative   Not on file   Social Drivers of Health   Financial Resource Strain: Low Risk  (03/26/2023)   Overall Financial Resource Strain (CARDIA)    Difficulty of Paying Living Expenses: Not hard at all  Food Insecurity: No Food Insecurity (03/26/2023)   Hunger Vital Sign    Worried About Running Out of Food in the Last Year: Never true    Ran Out of Food in the Last Year: Never true  Transportation Needs: No Transportation Needs (03/26/2023)   PRAPARE - Administrator, Civil Service (Medical): No    Lack of Transportation (Non-Medical): No  Physical Activity: Sufficiently Active (03/26/2023)   Exercise Vital Sign    Days of Exercise per Week: 5 days    Minutes of Exercise per Session: 40 min  Stress: No Stress Concern Present (03/26/2023)   [employer] of Occupational Health - Occupational Stress Questionnaire    Feeling of Stress : Not at all  Social Connections: Socially Integrated (03/26/2023)   Social Connection and Isolation Panel [NHANES]    Frequency of Communication with Friends and Family: More than three times a week    Frequency of Social Gatherings with Friends and Family: Twice a week    Attends Religious Services: More than 4 times per year    Active Member of [employer] or Organizations: Yes    Attends Engineer, Structural: More than 4 times per year    Marital Status: Married  Catering Manager Violence: Unknown (08/10/2021)   Received from [employer], [facility]   HITS    Physically Hurt: Not on file    Insult or Talk Down To: Not on file    Threaten Physical  Harm: Not on file    Scream or Curse: Not on file    Family History: Family History  Problem Relation Age of Onset   COPD Mother    Hemolytic uremic syndrome Father    Heart attack Brother    Diabetes Brother    Hypertension Brother    Allergies Son     Review of Systems: Review of Systems  Constitutional: Negative.   Respiratory: Negative.    Cardiovascular: Negative.   Gastrointestinal: Negative.   Neurological: Negative.     Physical Exam: Vital Signs  BP 124/86 (BP Location: Left Arm, Patient Position: Sitting, Cuff Size: Normal)   Pulse (!) 57   SpO2 100%   Physical Exam Constitutional:      General: Not in acute distress.    Appearance: Normal appearance. Not ill-appearing.  HENT:     Head: Normocephalic and atraumatic.  Eyes:     Pupils: Pupils are equal, round Neck:     Musculoskeletal: Normal range of motion.  Cardiovascular:     Rate and Rhythm: Normal rate    Pulses: Normal pulses.  Pulmonary:     Effort: Pulmonary effort is normal. No respiratory distress.  Abdominal:     General: Abdomen is flat. There is no distension.  Musculoskeletal: Normal range of motion.  Skin:    General: Skin is warm and dry.     Findings: No erythema or rash.  Neurological:     General: No focal deficit present.     Mental Status: Alert and oriented to person, place, and time. Mental status is at baseline.     Motor: No weakness.  Psychiatric:        Mood and Affect: Mood normal.        Behavior: Behavior normal.    Assessment/Plan: The patient is scheduled for panniculectomy at the upper abdominoplasty add on with Dr. Waddell.  Risks, benefits, and alternatives of procedure discussed, questions answered and consent obtained.    Smoking Status: Non-smoker; Counseling Given?  N/A  Caprini Score: 5; Risk Factors include: Age, BMI > 25, and length of planned surgery. Recommendation for mechanical prophylaxis. Encourage early ambulation.   Pictures obtained:  @consult   Post-op Rx sent to pharmacy:  Patient previously picked up oxycodone  and Zofran  after her last preop.  Patient was provided with the General Surgical Risk consent document and Pain Medication Agreement prior to their appointment.  They had adequate time to read through the risk consent documents and Pain Medication Agreement. We also discussed them in person together during this preop appointment. All of their questions were answered to their satisfaction.  Recommended calling if they have any further questions.  Risk consent form and Pain Medication Agreement to be scanned into patient's chart.  The risk that can be encountered for this procedure were discussed and include the following but not limited to these: asymmetry, fluid accumulation, firmness of the tissue, skin loss, decrease or no sensation, fat necrosis, bleeding, infection, healing delay.  Deep vein thrombosis, cardiac and pulmonary complications are risks to any procedure.  There are risks of anesthesia, changes to skin sensation and injury to nerves or blood vessels.  The muscle can be temporarily or permanently injured.  You may have an allergic reaction to tape, suture, glue, blood products which can result in skin discoloration, swelling, pain, skin lesions, poor healing.  Any of these can lead to the need for revisonal surgery or stage procedures.  Weight gain and weigh loss can also effect the long term appearance. The results are not guaranteed to last a lifetime.  Future surgery may be required.

## 2023-05-28 ENCOUNTER — Other Ambulatory Visit: Payer: Self-pay

## 2023-05-28 DIAGNOSIS — E538 Deficiency of other specified B group vitamins: Secondary | ICD-10-CM

## 2023-05-28 DIAGNOSIS — D509 Iron deficiency anemia, unspecified: Secondary | ICD-10-CM

## 2023-05-29 ENCOUNTER — Inpatient Hospital Stay: Attending: Hematology

## 2023-05-29 ENCOUNTER — Encounter: Payer: Self-pay | Admitting: Hematology

## 2023-05-29 ENCOUNTER — Inpatient Hospital Stay: Admitting: Hematology

## 2023-05-29 VITALS — BP 123/64 | HR 70 | Temp 97.3°F | Resp 16 | Wt 160.9 lb

## 2023-05-29 DIAGNOSIS — D509 Iron deficiency anemia, unspecified: Secondary | ICD-10-CM | POA: Diagnosis present

## 2023-05-29 DIAGNOSIS — Z9884 Bariatric surgery status: Secondary | ICD-10-CM | POA: Diagnosis not present

## 2023-05-29 DIAGNOSIS — E538 Deficiency of other specified B group vitamins: Secondary | ICD-10-CM

## 2023-05-29 LAB — CBC WITH DIFFERENTIAL (CANCER CENTER ONLY)
Abs Immature Granulocytes: 0 10*3/uL (ref 0.00–0.07)
Basophils Absolute: 0 10*3/uL (ref 0.0–0.1)
Basophils Relative: 1 %
Eosinophils Absolute: 0.1 10*3/uL (ref 0.0–0.5)
Eosinophils Relative: 2 %
HCT: 40.7 % (ref 36.0–46.0)
Hemoglobin: 12.9 g/dL (ref 12.0–15.0)
Immature Granulocytes: 0 %
Lymphocytes Relative: 57 %
Lymphs Abs: 1.7 10*3/uL (ref 0.7–4.0)
MCH: 29 pg (ref 26.0–34.0)
MCHC: 31.7 g/dL (ref 30.0–36.0)
MCV: 91.5 fL (ref 80.0–100.0)
Monocytes Absolute: 0.2 10*3/uL (ref 0.1–1.0)
Monocytes Relative: 8 %
Neutro Abs: 1 10*3/uL — ABNORMAL LOW (ref 1.7–7.7)
Neutrophils Relative %: 32 %
Platelet Count: 245 10*3/uL (ref 150–400)
RBC: 4.45 MIL/uL (ref 3.87–5.11)
RDW: 12.7 % (ref 11.5–15.5)
WBC Count: 3.1 10*3/uL — ABNORMAL LOW (ref 4.0–10.5)
nRBC: 0 % (ref 0.0–0.2)

## 2023-05-29 LAB — CMP (CANCER CENTER ONLY)
ALT: 21 U/L (ref 0–44)
AST: 22 U/L (ref 15–41)
Albumin: 4.5 g/dL (ref 3.5–5.0)
Alkaline Phosphatase: 70 U/L (ref 38–126)
Anion gap: 6 (ref 5–15)
BUN: 17 mg/dL (ref 8–23)
CO2: 31 mmol/L (ref 22–32)
Calcium: 10.1 mg/dL (ref 8.9–10.3)
Chloride: 101 mmol/L (ref 98–111)
Creatinine: 0.91 mg/dL (ref 0.44–1.00)
GFR, Estimated: >60 mL/min (ref >60)
Glucose, Bld: 86 mg/dL (ref 70–99)
Potassium: 4.2 mmol/L (ref 3.5–5.1)
Sodium: 138 mmol/L (ref 135–145)
Total Bilirubin: 0.5 mg/dL (ref 0.0–1.2)
Total Protein: 7.9 g/dL (ref 6.5–8.1)

## 2023-05-29 LAB — VITAMIN B12: Vitamin B-12: 873 pg/mL (ref 180–914)

## 2023-05-29 LAB — IRON AND IRON BINDING CAPACITY (CC-WL,HP ONLY)
Iron: 148 ug/dL (ref 28–170)
Saturation Ratios: 38 % — ABNORMAL HIGH (ref 10.4–31.8)
TIBC: 393 ug/dL (ref 250–450)
UIBC: 245 ug/dL (ref 148–442)

## 2023-05-29 LAB — FERRITIN: Ferritin: 55 ng/mL (ref 11–307)

## 2023-05-29 NOTE — Progress Notes (Signed)
HEMATOLOGY/ONCOLOGY CLINIC NOTE  Date of Service: 05/29/23    Patient Care Team: Arnette Felts, FNP as PCP - General (General Practice) O'Neal, Ronnald Ramp, MD as PCP - Cardiology (Cardiology)  GI- Starleen Arms CHIEF COMPLAINTS/PURPOSE OF CONSULTATION:  Follow-up for management of anemia  HISTORY OF PRESENTING ILLNESS:   Cindy Cain is a wonderful 63 y.o. female who has been referred to [place] by Arnette Felts, NP for evaluation and management of anemia.  The pt reports she has been iron deficient for a long time. She notes that she has been on po  Iron for>10 yrs- multivitamin with iron, iron 45mg +23 mg, constipation. -heavy periods. Hysterectomy 2005. 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 No overt GI bleeding. No menstrual losses.  Interval History   Cindy Cain is a 63 y.o. 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  Patient was last seen by me on 05/23/2022  and she complained of being tired more than usual, insomnia.  Patient notes she has been doing well overall without any new or severe medical concerns since our last visit. She denies any new infection issues, fever, chills, night sweats, unexpected weight loss, back pain, chest pain, abdominal pain, or leg swelling. She denies any abnormal bleeding issues.   Patient denies menstrual cycle. Her last menstrual cycle was around 18 years ago.   She has lost around 40 lbs since our last visit, intentional.   She continues to take iron  supplement, magnesium supplement, Vitamin D-3, and Vitamin B-12.   She is complaint with her medications.    MEDICAL HISTORY:  Past Medical History:  Diagnosis Date   Hypertension     SURGICAL HISTORY: Past Surgical History:  Procedure Laterality Date   ABDOMINAL HYSTERECTOMY     partial, secondary to bleeding and anemia   APPENDECTOMY     CHOLECYSTECTOMY     GASTRIC BYPASS      SOCIAL HISTORY: Social History   Socioeconomic History   Marital status: Married    Spouse name: Not on file   Number of children: 2   Years of education: Not on file   Highest education level: Bachelor's degree (e.g., BA, AB, BS)  Occupational History   Occupation: Retired Orthoptist  Tobacco Use   Smoking status: Never   Smokeless tobacco: Never  Vaping Use   Vaping status: Never Used  Substance and Sexual Activity   Alcohol use: No   Drug use: No   Sexual activity: Not on file  Other Topics Concern   Not on file  Social History Narrative   Not on file   Social Drivers of Health   Financial Resource Strain: Low Risk  (03/26/2023)   Overall Financial Resource Strain (CARDIA)    Difficulty of Paying Living Expenses: Not hard at all  Food Insecurity: No Food Insecurity (03/26/2023)   Hunger Vital Sign    Worried About Running Out of Food in the Last Year: Never true  Ran Out of Food in the Last Year: Never true  Transportation Needs: No Transportation Needs (03/26/2023)   PRAPARE - Administrator, Civil Service (Medical): No    Lack of Transportation (Non-Medical): No  Physical Activity: Sufficiently Active (03/26/2023)   Exercise Vital Sign    Days of Exercise per Week: 5 days    Minutes of Exercise per Session: 40 min  Stress: No Stress Concern Present (03/26/2023)   [employer] of Occupational Health - Occupational Stress Questionnaire    Feeling of Stress : Not at all  Social Connections: Socially Integrated (03/26/2023)   Social Connection and Isolation  Panel [NHANES]    Frequency of Communication with Friends and Family: More than three times a week    Frequency of Social Gatherings with Friends and Family: Twice a week    Attends Religious Services: More than 4 times per year    Active Member of [employer] or Organizations: Yes    Attends Engineer, structural: More than 4 times per year    Marital Status: Married  Catering manager Violence: Unknown (08/10/2021)   Received from [employer], [facility]   HITS    Physically Hurt: Not on file    Insult or Talk Down To: Not on file    Threaten Physical Harm: Not on file    Scream or Curse: Not on file    FAMILY HISTORY: Family History  Problem Relation Age of Onset   COPD Mother    Hemolytic uremic syndrome Father    Heart attack Brother    Diabetes Brother    Hypertension Brother    Allergies Son   maternal uncle - "leukemia" - ?ALL at age 94 yrs  ALLERGIES:  is allergic to aspirin and codeine.  MEDICATIONS:  Current Outpatient Medications  Medication Sig Dispense Refill   acetaminophen (TYLENOL) 325 MG tablet Take 325 mg by mouth as needed.     CALCIUM PO Take 1 tablet by mouth daily.     Cholecalciferol (VITAMIN D3) 125 MCG (5000 UT) CAPS Take by mouth.     Cyanocobalamin (VITAMIN B 12 PO) Take 1,000 mg by mouth daily.     docusate sodium (COLACE) 100 MG capsule Take by mouth as needed.     lisinopril-hydrochlorothiazide (ZESTORETIC) 10-12.5 MG tablet TAKE 1 TABLET BY MOUTH EVERY DAY 90 tablet 2   loratadine (CLARITIN) 10 MG tablet Take 1 tablet (10 mg total) by mouth daily. (Patient taking differently: Take 10 mg by mouth daily as needed.) 90 tablet 1   MAGNESIUM CHLORIDE PO Take 1,000 mg by mouth daily.     magnesium gluconate (MAGONATE) 500 MG tablet Take 500 mg by mouth daily.     magnesium oxide (MAG-OX) 400 MG tablet Take 400 mg by mouth daily.     meclizine (ANTIVERT) 12.5 MG tablet Take 1 tablet (12.5 mg total) by mouth 3 (three) times daily as needed for  dizziness. 30 tablet 2   Multiple Vitamins-Minerals (MULTIVITAMIN WITH MINERALS) tablet Take 1 tablet by mouth daily.     ondansetron (ZOFRAN) 4 MG tablet Take 1 tablet (4 mg total) by mouth every 8 (eight) hours as needed for up to 20 doses for nausea or vomiting. 20 tablet 0   oxyCODONE (ROXICODONE) 5 MG immediate release tablet Take 1 tablet (5 mg total) by mouth every 6 (six) hours as needed for up to 20 doses for severe pain (pain score 7-10). 20 tablet 0   Thiamine HCl (VITAMIN B-1) 250  MG tablet Take by mouth.     No current facility-administered medications for this visit.    REVIEW OF SYSTEMS:   .10 Point review of Systems was done is negative except as noted above.    PHYSICAL EXAMINATION: ECOG PERFORMANCE STATUS: 1 - Symptomatic but completely ambulatory  . Vitals:   05/29/23 1222  BP: 123/64  Pulse: 70  Resp: 16  Temp: (!) 97.3 F (36.3 C)  SpO2: 100%   Filed Weights   05/29/23 1222  Weight: 160 lb 14.4 oz (73 kg)   .Body mass index is 27.62 kg/m. Marland Kitchen GENERAL:alert, in no acute distress and comfortable SKIN: no acute rashes, no significant lesions EYES: conjunctiva are pink and non-injected, sclera anicteric OROPHARYNX: MMM, no exudates, no oropharyngeal erythema or ulceration NECK: supple, no JVD LYMPH:  no palpable lymphadenopathy in the cervical, axillary or inguinal regions LUNGS: clear to auscultation b/l with normal respiratory effort HEART: regular rate & rhythm ABDOMEN:  normoactive bowel sounds , non tender, not distended. Extremity: no pedal edema PSYCH: alert & oriented x 3 with fluent speech NEURO: no focal motor/sensory deficits   LABORATORY DATA:  I have reviewed the data as listed  .    Latest Ref Rng & Units 05/29/2023   11:52 AM 03/27/2023   12:14 PM 05/23/2022    9:43 AM  CBC  WBC 4.0 - 10.5 K/uL 3.1  3.5  4.3   Hemoglobin 12.0 - 15.0 g/dL 16.1  09.6  04.5   Hematocrit 36.0 - 46.0 % 40.7  37.7  41.7   Platelets 150 - 400 K/uL 245   223  235     .    Latest Ref Rng & Units 05/29/2023   11:52 AM 03/27/2023   12:14 PM 09/07/2022   11:39 AM  CMP  Glucose 70 - 99 mg/dL 86  79  72   BUN 8 - 23 mg/dL 17  17  23    Creatinine 0.44 - 1.00 mg/dL 4.09  8.11  9.14   Sodium 135 - 145 mmol/L 138  139  140   Potassium 3.5 - 5.1 mmol/L 4.2  4.0  4.3   Chloride 98 - 111 mmol/L 101  101  100   CO2 22 - 32 mmol/L 31  27  24    Calcium 8.9 - 10.3 mg/dL 78.2  9.2  9.5   Total Protein 6.5 - 8.1 g/dL 7.9     Total Bilirubin 0.0 - 1.2 mg/dL 0.5     Alkaline Phos 38 - 126 U/L 70     AST 15 - 41 U/L 22     ALT 0 - 44 U/L 21      . Lab Results  Component Value Date   IRON 148 05/29/2023   TIBC 393 05/29/2023   IRONPCTSAT 38 (H) 05/29/2023   (Iron and TIBC)  Lab Results  Component Value Date   FERRITIN 55 05/29/2023   B12 - 444--> 319  RADIOGRAPHIC STUDIES: I have personally reviewed the radiological images as listed and agreed with the findings in the report. No results found.  ASSESSMENT & PLAN:   63 yo with   1. 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 Cannot r/o GI losses  PLAN: -Discussed lab results from today, 05/29/2023, in detail with the patient. CBC shows slightly low WBC of 3.1 K, but well overall. CMP stable  Iron labs -ferritin 55 with iron  saturation of 38% -Educated the patient regarding neutropenia. Concern if it continues to drop.  -The goal is to keep her ferritin close to or greater than 100. -She can continue iron polysaccharide 150 mg p.o. daily for maintenance. -Continue sublingual B12 1000 mcg at least 4 days a week -Continue other vitamin supplements. Recommend to start B-Complex.  -Answered all of patient's questions.   FOLLOW-UP: RTC with Dr Candise Che with labs in 6 months  The total time spent in the appointment was 20 minutes* .  All of the patient's questions were answered with  apparent satisfaction. The patient knows to call the clinic with any problems, questions or concerns.   Wyvonnia Lora MD MS AAHIVMS [facility] Hematology/Oncology Physician [facility]  .*Total Encounter Time as defined by the Centers for Medicare and Medicaid Services includes, in addition to the face-to-face time of a patient visit (documented in the note above) non-face-to-face time: obtaining and reviewing outside history, ordering and reviewing medications, tests or procedures, care coordination (communications with other health care professionals or caregivers) and documentation in the medical record.   I,Param Shah,acting as a Neurosurgeon for Wyvonnia Lora, MD.,have documented all relevant documentation on the behalf of Wyvonnia Lora, MD,as directed by  Wyvonnia Lora, MD while in the presence of Wyvonnia Lora, MD.

## 2023-06-03 ENCOUNTER — Encounter: Payer: Self-pay | Admitting: Hematology

## 2023-06-04 ENCOUNTER — Encounter: Payer: Self-pay | Admitting: Hematology

## 2023-06-05 ENCOUNTER — Telehealth: Payer: Self-pay | Admitting: Hematology

## 2023-06-05 NOTE — Telephone Encounter (Signed)
Scheduled appointments per 1/27 scheduling message. Patient was made aware of the scheduled appointments via voicemail. Patient will be mailed an appointment reminder and is active on MyChart.

## 2023-06-11 DIAGNOSIS — Z719 Counseling, unspecified: Secondary | ICD-10-CM

## 2023-06-11 DIAGNOSIS — M793 Panniculitis, unspecified: Secondary | ICD-10-CM | POA: Diagnosis not present

## 2023-06-19 ENCOUNTER — Telehealth: Payer: Self-pay | Admitting: Surgical

## 2023-06-19 ENCOUNTER — Ambulatory Visit: Payer: Self-pay | Admitting: Surgical

## 2023-06-19 ENCOUNTER — Encounter: Payer: Self-pay | Admitting: Surgical

## 2023-06-19 VITALS — BP 108/52 | HR 51

## 2023-06-19 DIAGNOSIS — M793 Panniculitis, unspecified: Secondary | ICD-10-CM

## 2023-06-19 MED ORDER — IBUPROFEN 600 MG PO TABS: 600.0000 mg | ORAL_TABLET | Freq: Three times a day (TID) | ORAL | 0 refills | Status: DC | PRN

## 2023-06-19 NOTE — Progress Notes (Signed)
Patient is a 63 year old female here for follow-up after abdominoplasty with Dr. Ladona Ridgel on 06/11/2023.  She is 8 days postop.  She is here with her husband.  She reports overall she is doing well, she reports pain is overall well-controlled, she has been using oxycodone at night as needed, but reports she has been switching to only Tylenol ibuprofen.  She would like a prescription for ibuprofen if possible.  JP drain output bilaterally has been approximately 20 to 30 cc per 24 hours She is not having any infectious symptoms, reports normal urination and BMs. She is eating and drinking normally.  She does have some questions about restrictions.  Chaperone present on exam On exam umbilicus is viable, umbilical incision is intact and healing well.  There is no surrounding erythema or cellulitic changes. Abdominal incision is intact and healing well, no subcutaneous fluid collection noted palpation.  Abdomen is soft, nontender. Bilateral JP drains in place with serous drainage in each bulb  A/P:  Patient is overall doing well after abdominoplasty on 06/11/2023.  She is just over 1 week postop.  There is no signs of infection or concern on exam.  Encourage patient to continue to be up and ambulating and active, encouraged her to avoid lifting greater than 15 to 20 pounds, continue with compressive garment and avoid any strenuous activities.  Discussed avoiding submerging incisions in water, but showering is fine.  Right JP drain was removed, patient tolerated this well.  Will plan to reevaluate patient early next week or possibly Friday pending output.  Discussed that output needs to be 20/cc per 24 hours for 2 consecutive days prior to removal.  Will send ibuprofen to patient's pharmacy.  Recommend calling with questions or concerns.

## 2023-06-19 NOTE — Telephone Encounter (Signed)
Patient said that pharmacy is saying that they don't have meds ready, has prescription been sent? Please reach out

## 2023-06-22 ENCOUNTER — Encounter: Payer: Self-pay | Admitting: Surgical

## 2023-06-22 ENCOUNTER — Encounter: Admitting: Surgical

## 2023-06-22 ENCOUNTER — Ambulatory Visit: Admitting: Surgical

## 2023-06-22 VITALS — BP 116/59 | HR 52

## 2023-06-22 DIAGNOSIS — M793 Panniculitis, unspecified: Secondary | ICD-10-CM

## 2023-06-22 NOTE — Progress Notes (Signed)
63 year old female here for follow-up after abdominoplasty with Dr. Ladona Ridgel on 06/11/2023.  She is 11 days postop.  She was seen 3 days ago and was having low JP drain output bilaterally.  The right JP drain was removed and she tolerated this well.  She reports over the last 3 days JP drain output from the left side has been about 20 cc or less.  It has consecutively been 20 cc for the last 48 hours.  She emptied the drain last at 1 AM this morning and there is now approximately 3 cc in the bulb.  Chaperone present on exam On exam abdominal incision is intact, healing well.  Left JP drain in place with scant amount of serous fluid in the left JP drain bulb.  There is no erythema or cellulitic changes noted abdomen.  Abdomen is soft, nontender.  Umbilicus is viable, umbilical incision is intact and healing well.  No subcutaneous fluid collection noted palpation.   A/P:  Patient is doing well, left JP drain was removed due to output less than 20 cc for 2 consecutive days.  Patient tolerated this well.  There is no signs infection or concern on exam.  We discussed the importance of continuing to wear compressive garments 24/7 to avoid seroma formation.  Discussed with patient she can use Vaseline or Aquaphor on incisions and abdominal skin for skin dryness.  Recommend following up in about 10 days for reevaluation.  Call with questions or concerns.

## 2023-06-25 ENCOUNTER — Encounter: Payer: Self-pay | Admitting: Plastic Surgery

## 2023-06-26 ENCOUNTER — Encounter: Admitting: Surgical

## 2023-07-03 ENCOUNTER — Encounter: Admitting: Surgical

## 2023-07-05 ENCOUNTER — Encounter: Payer: Self-pay | Admitting: Hematology

## 2023-07-05 ENCOUNTER — Ambulatory Visit (INDEPENDENT_AMBULATORY_CARE_PROVIDER_SITE_OTHER): Payer: Self-pay | Admitting: Plastic Surgery

## 2023-07-05 ENCOUNTER — Encounter: Payer: Self-pay | Admitting: Plastic Surgery

## 2023-07-05 VITALS — BP 123/73 | HR 53

## 2023-07-05 DIAGNOSIS — E65 Localized adiposity: Secondary | ICD-10-CM

## 2023-07-05 DIAGNOSIS — Z9889 Other specified postprocedural states: Secondary | ICD-10-CM

## 2023-07-05 NOTE — Progress Notes (Signed)
 Cindy Cain follows up today approximately 4 weeks postop from an abdominoplasty.  She is doing very well with no specific complaints.  Activity has been going well with walking is much as she likes.  Bowel movements are normal.  On examination she has an outstanding result.  The incisions are clean dry and intact.  We discussed slowly increasing activity.  I would like for her to keep weight lifting to a minimum and to refrain from submerging incisions in water for another 2 weeks.  She may use stationary bike and treadmill as much as she likes.  Stretching should be only to the point of mild discomfort.  I have asked her to return in 4 to 6 weeks for postoperative pictures.  May return sooner for any questions or concerns.

## 2023-07-17 ENCOUNTER — Encounter: Admitting: Surgical

## 2023-08-08 NOTE — Progress Notes (Unsigned)
 63 year old female here for follow-up after abdominoplasty with Dr. Ladona Ridgel on 06/11/2023.  She is 2 months postop.  She reports she is doing really well.  She does not have any specific concerns or questions.  She reports she is very happy with her results.  Chaperone present on exam On exam abdominal incision is intact and well-healed.  Umbilicus is viable, umbilical incision is intact as well.  There is no subcutaneous fluid collection.  No erythema or cellulitic changes noted.    A/P:  Patient is doing well, no signs infection or concern on exam.  Pictures taken and placed in her chart with her permission.  Recommend following up as needed.  Discussed with patient continue with compressive garments when active, but no longer necessary to wear when not active or when sleeping.  No restrictions otherwise.  Increase abdominal exercise as tolerated, discussed abdominal core exercises can be started around 3 months postop.  Pictures were obtained of the patient and placed in the chart with the patient's or guardian's permission.   Discussed silicone scar creams and silicone scar tapes.

## 2023-08-09 ENCOUNTER — Encounter: Payer: Self-pay | Admitting: Hematology

## 2023-08-09 ENCOUNTER — Ambulatory Visit (INDEPENDENT_AMBULATORY_CARE_PROVIDER_SITE_OTHER): Admitting: Surgical

## 2023-08-09 VITALS — BP 120/76 | HR 52

## 2023-08-09 DIAGNOSIS — Z9889 Other specified postprocedural states: Secondary | ICD-10-CM

## 2023-08-09 DIAGNOSIS — E65 Localized adiposity: Secondary | ICD-10-CM

## 2023-08-09 DIAGNOSIS — M793 Panniculitis, unspecified: Secondary | ICD-10-CM

## 2023-08-17 ENCOUNTER — Encounter: Payer: Self-pay | Admitting: Hematology

## 2023-08-27 ENCOUNTER — Encounter: Payer: Self-pay | Admitting: Hematology

## 2023-09-17 ENCOUNTER — Encounter: Payer: Self-pay | Admitting: Hematology

## 2023-09-20 ENCOUNTER — Encounter: Payer: Self-pay | Admitting: Hematology

## 2023-09-24 ENCOUNTER — Ambulatory Visit: Admitting: Nurse Practitioner

## 2023-09-24 ENCOUNTER — Encounter: Payer: Self-pay | Admitting: Hematology

## 2023-09-24 ENCOUNTER — Encounter: Payer: Self-pay | Admitting: Nurse Practitioner

## 2023-09-24 VITALS — BP 120/80 | HR 45 | Temp 97.8°F | Ht 64.0 in | Wt 161.4 lb

## 2023-09-24 DIAGNOSIS — Z6827 Body mass index (BMI) 27.0-27.9, adult: Secondary | ICD-10-CM | POA: Diagnosis not present

## 2023-09-24 DIAGNOSIS — E782 Mixed hyperlipidemia: Secondary | ICD-10-CM | POA: Diagnosis not present

## 2023-09-24 DIAGNOSIS — I10 Essential (primary) hypertension: Secondary | ICD-10-CM | POA: Diagnosis not present

## 2023-09-24 DIAGNOSIS — E663 Overweight: Secondary | ICD-10-CM

## 2023-09-24 DIAGNOSIS — Z2821 Immunization not carried out because of patient refusal: Secondary | ICD-10-CM

## 2023-09-24 LAB — BMP8+EGFR
BUN/Creatinine Ratio: 19 (ref 12–28)
BUN: 18 mg/dL (ref 8–27)
CO2: 22 mmol/L (ref 20–29)
Calcium: 9.3 mg/dL (ref 8.7–10.3)
Chloride: 101 mmol/L (ref 96–106)
Creatinine, Ser: 0.96 mg/dL (ref 0.57–1.00)
Glucose: 72 mg/dL (ref 70–99)
Potassium: 4.2 mmol/L (ref 3.5–5.2)
Sodium: 139 mmol/L (ref 134–144)
eGFR: 67 mL/min/{1.73_m2} (ref >59)

## 2023-09-24 MED ORDER — LISINOPRIL-HYDROCHLOROTHIAZIDE 10-12.5 MG PO TABS: 1.0000 | ORAL_TABLET | Freq: Every day | ORAL | 2 refills | Status: AC

## 2023-09-24 NOTE — Assessment & Plan Note (Signed)
 Blood pressure is well controlled, continue current medications.

## 2023-09-24 NOTE — Progress Notes (Signed)
 [place], CMA,acting as a Neurosurgeon for Cindy Epley, FNP.,have documented all relevant documentation on the behalf of Cindy Epley, FNP,as directed by  Cindy Epley, FNP while in the presence of Cindy Epley, FNP.  Subjective:  Patient ID: Cindy Cain , female    DOB: 1960-06-24 , 63 y.o.   MRN: [id number]  Chief Complaint  Patient presents with   Hypertension    Patient presents today for a bp and chol follow up, Patient reports compliance with medication. Patient denies any chest pain, SOB, or headaches. Patient has no concerns today.     HPI  Here for blood pressure f/u. She had her abdominoplasty in February and is doing well.      Past Medical History:  Diagnosis Date   Body mass index (BMI) of 35.0 to 35.9 in adult 01/23/2018   IMO SNOMED Dx Update Oct 2024     Hypertension    Knee pain 01/23/2018   Persistent cough 06/23/2019     Family History  Problem Relation Age of Onset   COPD Mother    Hemolytic uremic syndrome Father    Heart attack Brother    Diabetes Brother    Hypertension Brother    Allergies Son      Current Outpatient Medications:    acetaminophen  (TYLENOL ) 325 MG tablet, Take 325 mg by mouth as needed., Disp: , Rfl:    CALCIUM PO, Take 1 tablet by mouth daily., Disp: , Rfl:    Cholecalciferol (VITAMIN D3) 125 MCG (5000 UT) CAPS, Take by mouth., Disp: , Rfl:    Cyanocobalamin  (VITAMIN B 12 PO), Take 1,000 mg by mouth daily., Disp: , Rfl:    docusate sodium (COLACE) 100 MG capsule, Take by mouth as needed., Disp: , Rfl:    ibuprofen  (ADVIL ) 600 MG tablet, Take 1 tablet (600 mg total) by mouth every 8 (eight) hours as needed for moderate pain (pain score 4-6). For use AFTER surgery, Disp: 30 tablet, Rfl: 0   magnesium gluconate (MAGONATE) 500 MG tablet, Take 500 mg by mouth daily., Disp: , Rfl:    meclizine  (ANTIVERT ) 12.5 MG tablet, Take 1 tablet (12.5 mg total) by mouth 3 (three) times daily as needed for dizziness., Disp: 30 tablet, Rfl: 2    Multiple Vitamins-Minerals (MULTIVITAMIN WITH MINERALS) tablet, Take 1 tablet by mouth daily., Disp: , Rfl:    ondansetron  (ZOFRAN ) 4 MG tablet, Take 1 tablet (4 mg total) by mouth every 8 (eight) hours as needed for up to 20 doses for nausea or vomiting., Disp: 20 tablet, Rfl: 0   oxyCODONE  (ROXICODONE ) 5 MG immediate release tablet, Take 1 tablet (5 mg total) by mouth every 6 (six) hours as needed for up to 20 doses for severe pain (pain score 7-10)., Disp: 20 tablet, Rfl: 0   Thiamine HCl (VITAMIN B-1) 250 MG tablet, Take by mouth., Disp: , Rfl:    lisinopril -hydrochlorothiazide  (ZESTORETIC ) 10-12.5 MG tablet, Take 1 tablet by mouth daily., Disp: 90 tablet, Rfl: 2   loratadine  (CLARITIN ) 10 MG tablet, Take 1 tablet (10 mg total) by mouth daily. (Patient taking differently: Take 10 mg by mouth daily as needed.), Disp: 90 tablet, Rfl: 1   Allergies  Allergen Reactions   Aspirin Other (See Comments)    Ulcers    Codeine Other (See Comments)    ulcers     Review of Systems  Constitutional: Negative.   Respiratory: Negative.    Cardiovascular: Negative.   Gastrointestinal:  Positive for constipation.  Neurological:  Negative for dizziness, weakness and light-headedness.     Today's Vitals   09/24/23 1035  BP: 120/80  Pulse: (!) 45  Temp: 97.8 F (36.6 C)  TempSrc: Oral  Weight: 161 lb 6.4 oz (73.2 kg)  Height: 5\' 4"  (1.626 m)  PainSc: 0-No pain   Body mass index is 27.7 kg/m.  Wt Readings from Last 3 Encounters:  09/24/23 161 lb 6.4 oz (73.2 kg)  05/29/23 160 lb 14.4 oz (73 kg)  04/16/23 165 lb 12.8 oz (75.2 kg)     Objective:  Physical Exam Vitals and nursing note reviewed.  Constitutional:      General: She is not in acute distress.    Appearance: Normal appearance.  Cardiovascular:     Rate and Rhythm: Normal rate and regular rhythm.     Pulses: Normal pulses.     Heart sounds: Normal heart sounds. No murmur heard. Pulmonary:     Effort: Pulmonary effort is  normal. No respiratory distress.     Breath sounds: Normal breath sounds. No wheezing.  Skin:    General: Skin is warm and dry.     Capillary Refill: Capillary refill takes less than 2 seconds.  Neurological:     General: No focal deficit present.     Mental Status: She is alert and oriented to person, place, and time.     Cranial Nerves: No cranial nerve deficit.     Motor: No weakness.      Assessment And Plan:  Essential hypertension Assessment & Plan: Blood pressure is well controlled, continue current medications  Orders: -     BMP8+eGFR -     Lisinopril -hydroCHLOROthiazide ; Take 1 tablet by mouth daily.  Dispense: 90 tablet; Refill: 2  Mixed hyperlipidemia Assessment & Plan: Cholesterol levels were normal at last visit, will not recheck today    Herpes zoster vaccination declined Assessment & Plan: Declines shingrix , educated on disease process and is aware if he changes his mind to notify office    Overweight with body mass index (BMI) of 27 to 27.9 in adult    Return for HM in November.  Patient was given opportunity to ask questions. Patient verbalized understanding of the plan and was able to repeat key elements of the plan. All questions were answered to their satisfaction.    Cindy Mangle, FNP, have reviewed all documentation for this visit. The documentation on 09/24/23 for the exam, diagnosis, procedures, and orders are all accurate and complete.   IF YOU HAVE BEEN REFERRED TO A SPECIALIST, IT MAY TAKE 1-2 WEEKS TO SCHEDULE/PROCESS THE REFERRAL. IF YOU HAVE NOT HEARD FROM US /SPECIALIST IN TWO WEEKS, PLEASE GIVE US  A CALL AT [phone].

## 2023-09-24 NOTE — Assessment & Plan Note (Signed)
 Declines shingrix, educated on disease process and is aware if he changes his mind to notify office

## 2023-09-24 NOTE — Assessment & Plan Note (Signed)
 Cholesterol levels were normal at last visit, will not recheck today

## 2023-09-25 ENCOUNTER — Ambulatory Visit: Payer: Self-pay | Admitting: Nurse Practitioner

## 2023-10-24 ENCOUNTER — Encounter: Payer: Self-pay | Admitting: Hematology

## 2023-11-30 ENCOUNTER — Other Ambulatory Visit: Payer: Self-pay

## 2023-11-30 DIAGNOSIS — E538 Deficiency of other specified B group vitamins: Secondary | ICD-10-CM

## 2023-11-30 DIAGNOSIS — D509 Iron deficiency anemia, unspecified: Secondary | ICD-10-CM

## 2023-12-02 NOTE — Progress Notes (Signed)
 HEMATOLOGY/ONCOLOGY CLINIC NOTE  Date of Service: 12/03/2023   Patient Care Team: Georgina Speaks, FNP as PCP - General (General Practice) O'Neal, Darryle Ned, MD as PCP - Cardiology (Cardiology)  GI- Wilnette Sous CHIEF COMPLAINTS/PURPOSE OF CONSULTATION:  Follow-up for management of anemia  HISTORY OF PRESENTING ILLNESS:   Cindy Cain is a wonderful 63 y.o. female who has been referred to us  by Speaks Georgina, NP for evaluation and management of anemia.  The pt reports she has been iron deficient for a long time. She notes that she has been on po  Iron for>10 yrs- multivitamin with iron, iron 45mg +23 mg, constipation. -heavy periods. Hysterectomy 2005. -roux en gastric bypass in 2013 -vegan diet for 4 yrs -vertigo -covid 04/2019- has pulmonary f/u  Most recent lab results (01/21/2020) of CBC & CMP is as follows: all values are WNL except for Hgb at 9.7, MCV at 75, MCH at 20.6, MCHC at 27.7, RDW at 15.7.SABRA 01/21/2020 Vitamin D at 66.2 01/21/2020 TIBC at 462, UIBC at 433, Iron at 29, Iron Sat at 6, Ferritin at 10  On review of systems, pt reports significant fatigue and some DOE. No overt GI bleeding. No menstrual losses.  Interval History   Cindy Cain is a 63 y.o. 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  Patient was last seen by me on 05/29/2023 and reported a 40-pound intentional weight loss, and reported no new medical complaints.  Hemoglobin remains within normal limits at 12.6 but she has had some mild leukopenia and neutropenia which we discussed. She notes no acute new symptoms and is feeling well.  No overt pagophagia. No new fatigue chest pain or shortness of breath. No infection issues. Denies being told that she had low white blood counts when she was younger.   MEDICAL HISTORY:  Past Medical History:  Diagnosis Date   Body mass index (BMI) of 35.0 to 35.9 in adult 01/23/2018    IMO SNOMED Dx Update Oct 2024     Hypertension    Knee pain 01/23/2018   Persistent cough 06/23/2019    SURGICAL HISTORY: Past Surgical History:  Procedure Laterality Date   ABDOMINAL HYSTERECTOMY     partial, secondary to bleeding and anemia   APPENDECTOMY     CHOLECYSTECTOMY     GASTRIC BYPASS      SOCIAL HISTORY: Social History   Socioeconomic History   Marital status: Married    Spouse name: Not on file   Number of children: 2   Years of education: Not on file   Highest education level: Bachelor's degree (e.g., BA, AB, BS)  Occupational History   Occupation: Retired Orthoptist  Tobacco Use   Smoking status: Never   Smokeless tobacco: Never  Vaping Use   Vaping status: Never Used  Substance and Sexual Activity   Alcohol use: No   Drug use: No   Sexual activity: Not on file  Other Topics Concern   Not on file  Social History Narrative   Not on file   Social Drivers of Health   Financial Resource Strain: Low Risk  (03/26/2023)   Overall Financial Resource Strain (CARDIA)    Difficulty of Paying Living Expenses: Not hard at all  Food Insecurity: No Food Insecurity (03/26/2023)   Hunger Vital Sign    Worried About Running Out of Food in the Last Year: Never true    Ran Out of Food in the  Last Year: Never true  Transportation Needs: No Transportation Needs (03/26/2023)   PRAPARE - Administrator, Civil Service (Medical): No    Lack of Transportation (Non-Medical): No  Physical Activity: Sufficiently Active (03/26/2023)   Exercise Vital Sign    Days of Exercise per Week: 5 days    Minutes of Exercise per Session: 40 min  Stress: No Stress Concern Present (03/26/2023)   [employer] of Occupational Health - Occupational Stress Questionnaire    Feeling of Stress : Not at all  Social Connections: Socially Integrated (03/26/2023)   Social Connection and Isolation Panel    Frequency of Communication with Friends and Family: More than three times  a week    Frequency of Social Gatherings with Friends and Family: Twice a week    Attends Religious Services: More than 4 times per year    Active Member of [employer] or Organizations: Yes    Attends Banker Meetings: More than 4 times per year    Marital Status: Married  Catering manager Violence: Unknown (08/10/2021)   Received from [facility]   HITS    Physically Hurt: Not on file    Insult or Talk Down To: Not on file    Threaten Physical Harm: Not on file    Scream or Curse: Not on file    FAMILY HISTORY: Family History  Problem Relation Age of Onset   COPD Mother    Hemolytic uremic syndrome Father    Heart attack Brother    Diabetes Brother    Hypertension Brother    Allergies Son   maternal uncle - leukemia - ?ALL at age 4 yrs  ALLERGIES:  is allergic to aspirin and codeine.  MEDICATIONS:  Current Outpatient Medications  Medication Sig Dispense Refill   acetaminophen  (TYLENOL ) 325 MG tablet Take 325 mg by mouth as needed.     CALCIUM PO Take 1 tablet by mouth daily.     Cholecalciferol (VITAMIN D3) 125 MCG (5000 UT) CAPS Take by mouth.     Cyanocobalamin  (VITAMIN B 12 PO) Take 1,000 mg by mouth daily.     docusate sodium (COLACE) 100 MG capsule Take by mouth as needed.     ibuprofen  (ADVIL ) 600 MG tablet Take 1 tablet (600 mg total) by mouth every 8 (eight) hours as needed for moderate pain (pain score 4-6). For use AFTER surgery 30 tablet 0   lisinopril -hydrochlorothiazide  (ZESTORETIC ) 10-12.5 MG tablet Take 1 tablet by mouth daily. 90 tablet 2   loratadine  (CLARITIN ) 10 MG tablet Take 1 tablet (10 mg total) by mouth daily. (Patient taking differently: Take 10 mg by mouth daily as needed.) 90 tablet 1   magnesium gluconate (MAGONATE) 500 MG tablet Take 500 mg by mouth daily.     meclizine  (ANTIVERT ) 12.5 MG tablet Take 1 tablet (12.5 mg total) by mouth 3 (three) times daily as needed for dizziness. 30 tablet 2   Multiple Vitamins-Minerals (MULTIVITAMIN  WITH MINERALS) tablet Take 1 tablet by mouth daily.     ondansetron  (ZOFRAN ) 4 MG tablet Take 1 tablet (4 mg total) by mouth every 8 (eight) hours as needed for up to 20 doses for nausea or vomiting. 20 tablet 0   oxyCODONE  (ROXICODONE ) 5 MG immediate release tablet Take 1 tablet (5 mg total) by mouth every 6 (six) hours as needed for up to 20 doses for severe pain (pain score 7-10). 20 tablet 0   Thiamine HCl (VITAMIN B-1) 250 MG tablet Take by mouth.  No current facility-administered medications for this visit.    REVIEW OF SYSTEMS:    .10 Point review of Systems was done is negative except as noted above.  PHYSICAL EXAMINATION: ECOG PERFORMANCE STATUS: 1 - Symptomatic but completely ambulatory  . Vitals:   12/03/23 1154  BP: 119/72  Pulse: (!) 49  Resp: 18  Temp: 98.3 F (36.8 C)  SpO2: 100%    Filed Weights   12/03/23 1154  Weight: 158 lb 8 oz (71.9 kg)    .Body mass index is 27.21 kg/m.  GENERAL:alert, in no acute distress and comfortable SKIN: no acute rashes, no significant lesions EYES: conjunctiva are pink and non-injected, sclera anicteric OROPHARYNX: MMM, no exudates, no oropharyngeal erythema or ulceration NECK: supple, no JVD LYMPH:  no palpable lymphadenopathy in the cervical, axillary or inguinal regions LUNGS: clear to auscultation b/l with normal respiratory effort HEART: regular rate & rhythm ABDOMEN:  normoactive bowel sounds , non tender, not distended. Extremity: no pedal edema PSYCH: alert & oriented x 3 with fluent speech NEURO: no focal motor/sensory deficits   LABORATORY DATA:  I have reviewed the data as listed  .    Latest Ref Rng & Units 12/03/2023   11:39 AM 05/29/2023   11:52 AM 03/27/2023   12:14 PM  CBC  WBC 4.0 - 10.5 K/uL 3.5  3.1  3.5   Hemoglobin 12.0 - 15.0 g/dL 87.3  87.0  87.9   Hematocrit 36.0 - 46.0 % 38.7  40.7  37.7   Platelets 150 - 400 K/uL 243  245  223    ANC 1000     Latest Ref Rng & Units 09/24/2023    11:12 AM 05/29/2023   11:52 AM 03/27/2023   12:14 PM  CMP  Glucose 70 - 99 mg/dL 72  86  79   BUN 8 - 27 mg/dL 18  17  17    Creatinine 0.57 - 1.00 mg/dL 9.03  9.08  9.11   Sodium 134 - 144 mmol/L 139  138  139   Potassium 3.5 - 5.2 mmol/L 4.2  4.2  4.0   Chloride 96 - 106 mmol/L 101  101  101   CO2 20 - 29 mmol/L 22  31  27    Calcium 8.7 - 10.3 mg/dL 9.3  89.8  9.2   Total Protein 6.5 - 8.1 g/dL  7.9    Total Bilirubin 0.0 - 1.2 mg/dL  0.5    Alkaline Phos 38 - 126 U/L  70    AST 15 - 41 U/L  22    ALT 0 - 44 U/L  21     . Lab Results  Component Value Date   IRON 148 05/29/2023   TIBC 393 05/29/2023   IRONPCTSAT 38 (H) 05/29/2023   (Iron and TIBC)  Lab Results  Component Value Date   FERRITIN 55 05/29/2023   B12 - 444--> 319  RADIOGRAPHIC STUDIES: I have personally reviewed the radiological images as listed and agreed with the findings in the report. No results found.  ASSESSMENT & PLAN:   63 yo female with:  1. 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 Cannot r/o GI losses PLAN: -discussed lab results from today, 12/03/2023, in detail with patient - Hemoglobin levels show near resolution of her anemia with much improved ferritin and iron profile. - Her ANC continues to remain slightly low FOLLOW-UP: Phone visit with 6 months  with Dr Onesimo  Labs 7-10 days prior to phone visit  The total time spent in the appointment was *** minutes* .  All of the patient's questions were answered with apparent satisfaction. The patient knows to call the clinic with any problems, questions or concerns.   Emaline Onesimo MD MS AAHIVMS [facility] Hematology/Oncology Physician [place] [facility]  .*Total Encounter Time as defined by the Centers for Medicare and Medicaid Services includes, in addition to the face-to-face time of a patient visit (documented in the note  above) non-face-to-face time: obtaining and reviewing outside history, ordering and reviewing medications, tests or procedures, care coordination (communications with other health care professionals or caregivers) and documentation in the medical record.    I,Mitra Faeizi,acting as a Neurosurgeon for Emaline Onesimo, MD.,have documented all relevant documentation on the behalf of Emaline Onesimo, MD,as directed by  Emaline Onesimo, MD while in the presence of Emaline Onesimo, MD.  ***

## 2023-12-03 ENCOUNTER — Inpatient Hospital Stay (HOSPITAL_BASED_OUTPATIENT_CLINIC_OR_DEPARTMENT_OTHER): Admitting: Hematology

## 2023-12-03 ENCOUNTER — Inpatient Hospital Stay: Attending: Hematology

## 2023-12-03 ENCOUNTER — Telehealth: Payer: Self-pay

## 2023-12-03 VITALS — BP 119/72 | HR 49 | Temp 98.3°F | Resp 18 | Wt 158.5 lb

## 2023-12-03 DIAGNOSIS — D509 Iron deficiency anemia, unspecified: Secondary | ICD-10-CM | POA: Insufficient documentation

## 2023-12-03 DIAGNOSIS — E538 Deficiency of other specified B group vitamins: Secondary | ICD-10-CM

## 2023-12-03 DIAGNOSIS — Z9884 Bariatric surgery status: Secondary | ICD-10-CM | POA: Insufficient documentation

## 2023-12-03 DIAGNOSIS — N920 Excessive and frequent menstruation with regular cycle: Secondary | ICD-10-CM | POA: Diagnosis not present

## 2023-12-03 DIAGNOSIS — D709 Neutropenia, unspecified: Secondary | ICD-10-CM

## 2023-12-03 LAB — CBC WITH DIFFERENTIAL (CANCER CENTER ONLY)
Abs Immature Granulocytes: 0.01 K/uL (ref 0.00–0.07)
Basophils Absolute: 0 K/uL (ref 0.0–0.1)
Basophils Relative: 1 %
Eosinophils Absolute: 0.2 K/uL (ref 0.0–0.5)
Eosinophils Relative: 4 %
HCT: 38.7 % (ref 36.0–46.0)
Hemoglobin: 12.6 g/dL (ref 12.0–15.0)
Immature Granulocytes: 0 %
Lymphocytes Relative: 56 %
Lymphs Abs: 1.9 K/uL (ref 0.7–4.0)
MCH: 28.5 pg (ref 26.0–34.0)
MCHC: 32.6 g/dL (ref 30.0–36.0)
MCV: 87.6 fL (ref 80.0–100.0)
Monocytes Absolute: 0.3 K/uL (ref 0.1–1.0)
Monocytes Relative: 9 %
Neutro Abs: 1 K/uL — ABNORMAL LOW (ref 1.7–7.7)
Neutrophils Relative %: 30 %
Platelet Count: 243 K/uL (ref 150–400)
RBC: 4.42 MIL/uL (ref 3.87–5.11)
RDW: 13.2 % (ref 11.5–15.5)
WBC Count: 3.5 K/uL — ABNORMAL LOW (ref 4.0–10.5)
nRBC: 0 % (ref 0.0–0.2)

## 2023-12-03 LAB — CMP (CANCER CENTER ONLY)
ALT: 54 U/L — ABNORMAL HIGH (ref 0–44)
AST: 50 U/L — ABNORMAL HIGH (ref 15–41)
Albumin: 3.7 g/dL (ref 3.5–5.0)
Alkaline Phosphatase: 64 U/L (ref 38–126)
Anion gap: 4 — ABNORMAL LOW (ref 5–15)
BUN: 17 mg/dL (ref 8–23)
CO2: 31 mmol/L (ref 22–32)
Calcium: 9.1 mg/dL (ref 8.9–10.3)
Chloride: 106 mmol/L (ref 98–111)
Creatinine: 0.99 mg/dL (ref 0.44–1.00)
GFR, Estimated: >60 mL/min (ref >60)
Glucose, Bld: 86 mg/dL (ref 70–99)
Potassium: 4.3 mmol/L (ref 3.5–5.1)
Sodium: 141 mmol/L (ref 135–145)
Total Bilirubin: 0.5 mg/dL (ref 0.0–1.2)
Total Protein: 6.8 g/dL (ref 6.5–8.1)

## 2023-12-03 LAB — FERRITIN: Ferritin: 55 ng/mL (ref 11–307)

## 2023-12-03 LAB — VITAMIN B12: Vitamin B-12: 857 pg/mL (ref 180–914)

## 2023-12-03 LAB — IRON AND IRON BINDING CAPACITY (CC-WL,HP ONLY)
Iron: 164 ug/dL (ref 28–170)
Saturation Ratios: 49 % — ABNORMAL HIGH (ref 10.4–31.8)
TIBC: 337 ug/dL (ref 250–450)
UIBC: 173 ug/dL (ref 148–442)

## 2023-12-03 NOTE — Telephone Encounter (Signed)
 Cindy Cain has been made aware of her lab appointment and her follow up telephone appointment.

## 2023-12-10 NOTE — Progress Notes (Incomplete)
 HEMATOLOGY/ONCOLOGY CLINIC NOTE  Date of Service: 12/03/2023   Patient Care Team: Georgina Speaks, FNP as PCP - General (General Practice) O'Neal, Darryle Ned, MD as PCP - Cardiology (Cardiology)  GI- Wilnette Sous CHIEF COMPLAINTS/PURPOSE OF CONSULTATION:  Follow-up for management of anemia  HISTORY OF PRESENTING ILLNESS:   Cindy Cain is a wonderful 63 y.o. female who has been referred to us  by Speaks Georgina, NP for evaluation and management of anemia.  The pt reports she has been iron deficient for a long time. She notes that she has been on po  Iron for>10 yrs- multivitamin with iron, iron 45mg +23 mg, constipation. -heavy periods. Hysterectomy 2005. -roux en gastric bypass in 2013 -vegan diet for 4 yrs -vertigo -covid 04/2019- has pulmonary f/u  Most recent lab results (01/21/2020) of CBC & CMP is as follows: all values are WNL except for Hgb at 9.7, MCV at 75, MCH at 20.6, MCHC at 27.7, RDW at 15.7.SABRA 01/21/2020 Vitamin D at 66.2 01/21/2020 TIBC at 462, UIBC at 433, Iron at 29, Iron Sat at 6, Ferritin at 10  On review of systems, pt reports significant fatigue and some DOE. No overt GI bleeding. No menstrual losses.  Interval History   Cindy Cain is a 63 y.o. 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  Patient was last seen by me on 05/29/2023 and reported a 40-pound intentional weight loss, and reported no new medical complaints.  Hemoglobin remains within normal limits at 12.6 but she has had some mild leukopenia and neutropenia which we discussed. She notes no acute new symptoms and is feeling well.  No overt pagophagia. No new fatigue chest pain or shortness of breath. No infection issues. Denies being told that she had low white blood counts when she was younger.   MEDICAL HISTORY:  Past Medical History:  Diagnosis Date  . Body mass index (BMI) of 35.0 to 35.9 in adult 01/23/2018    IMO SNOMED Dx Update Oct 2024    . Hypertension   . Knee pain 01/23/2018  . Persistent cough 06/23/2019    SURGICAL HISTORY: Past Surgical History:  Procedure Laterality Date  . ABDOMINAL HYSTERECTOMY     partial, secondary to bleeding and anemia  . APPENDECTOMY    . CHOLECYSTECTOMY    . GASTRIC BYPASS      SOCIAL HISTORY: Social History   Socioeconomic History  . Marital status: Married    Spouse name: Not on file  . Number of children: 2  . Years of education: Not on file  . Highest education level: Bachelor's degree (e.g., BA, AB, BS)  Occupational History  . Occupation: Retired Pharmacologist  . Smoking status: Never  . Smokeless tobacco: Never  Vaping Use  . Vaping status: Never Used  Substance and Sexual Activity  . Alcohol use: No  . Drug use: No  . Sexual activity: Not on file  Other Topics Concern  . Not on file  Social History Narrative  . Not on file   Social Drivers of Health   Financial Resource Strain: Low Risk  (03/26/2023)   Overall Financial Resource Strain (CARDIA)   . Difficulty of Paying Living Expenses: Not hard at all  Food Insecurity: No Food Insecurity (03/26/2023)   Hunger Vital Sign   . Worried About Programme researcher, broadcasting/film/video in the Last Year: Never true   . Ran Out of Food in the  Last Year: Never true  Transportation Needs: No Transportation Needs (03/26/2023)   PRAPARE - Transportation   . Lack of Transportation (Medical): No   . Lack of Transportation (Non-Medical): No  Physical Activity: Sufficiently Active (03/26/2023)   Exercise Vital Sign   . Days of Exercise per Week: 5 days   . Minutes of Exercise per Session: 40 min  Stress: No Stress Concern Present (03/26/2023)   [employer] of Occupational Health - Occupational Stress Questionnaire   . Feeling of Stress : Not at all  Social Connections: Socially Integrated (03/26/2023)   Social Connection and Isolation Panel   . Frequency of Communication with Friends  and Family: More than three times a week   . Frequency of Social Gatherings with Friends and Family: Twice a week   . Attends Religious Services: More than 4 times per year   . Active Member of Clubs or Organizations: Yes   . Attends Banker Meetings: More than 4 times per year   . Marital Status: Married  Catering manager Violence: Unknown (08/10/2021)   Received [facility]   HITS   . Physically Hurt: Not on file   . Insult or Talk Down To: Not on file   . Threaten Physical Harm: Not on file   . Scream or Curse: Not on file    FAMILY HISTORY: Family History  Problem Relation Age of Onset  . COPD Mother   . Hemolytic uremic syndrome Father   . Heart attack Brother   . Diabetes Brother   . Hypertension Brother   . Allergies Son   maternal uncle - leukemia - ?ALL at age 36 yrs  ALLERGIES:  is allergic to aspirin and codeine.  MEDICATIONS:  Current Outpatient Medications  Medication Sig Dispense Refill  . acetaminophen  (TYLENOL ) 325 MG tablet Take 325 mg by mouth as needed.    SABRA CALCIUM PO Take 1 tablet by mouth daily.    . Cholecalciferol (VITAMIN D3) 125 MCG (5000 UT) CAPS Take by mouth.    . Cyanocobalamin  (VITAMIN B 12 PO) Take 1,000 mg by mouth daily.    SABRA docusate sodium (COLACE) 100 MG capsule Take by mouth as needed.    . ibuprofen  (ADVIL ) 600 MG tablet Take 1 tablet (600 mg total) by mouth every 8 (eight) hours as needed for moderate pain (pain score 4-6). For use AFTER surgery 30 tablet 0  . lisinopril -hydrochlorothiazide  (ZESTORETIC ) 10-12.5 MG tablet Take 1 tablet by mouth daily. 90 tablet 2  . loratadine  (CLARITIN ) 10 MG tablet Take 1 tablet (10 mg total) by mouth daily. (Patient taking differently: Take 10 mg by mouth daily as needed.) 90 tablet 1  . magnesium gluconate (MAGONATE) 500 MG tablet Take 500 mg by mouth daily.    . meclizine  (ANTIVERT ) 12.5 MG tablet Take 1 tablet (12.5 mg total) by mouth 3 (three) times daily as needed for  dizziness. 30 tablet 2  . Multiple Vitamins-Minerals (MULTIVITAMIN WITH MINERALS) tablet Take 1 tablet by mouth daily.    . ondansetron  (ZOFRAN ) 4 MG tablet Take 1 tablet (4 mg total) by mouth every 8 (eight) hours as needed for up to 20 doses for nausea or vomiting. 20 tablet 0  . oxyCODONE  (ROXICODONE ) 5 MG immediate release tablet Take 1 tablet (5 mg total) by mouth every 6 (six) hours as needed for up to 20 doses for severe pain (pain score 7-10). 20 tablet 0  . Thiamine HCl (VITAMIN B-1) 250 MG tablet Take by mouth.  No current facility-administered medications for this visit.    REVIEW OF SYSTEMS:    .10 Point review of Systems was done is negative except as noted above.  PHYSICAL EXAMINATION: ECOG PERFORMANCE STATUS: 1 - Symptomatic but completely ambulatory  . Vitals:   12/03/23 1154  BP: 119/72  Pulse: (!) 49  Resp: 18  Temp: 98.3 F (36.8 C)  SpO2: 100%    Filed Weights   12/03/23 1154  Weight: 158 lb 8 oz (71.9 kg)    .Body mass index is 27.21 kg/m.  GENERAL:alert, in no acute distress and comfortable SKIN: no acute rashes, no significant lesions EYES: conjunctiva are pink and non-injected, sclera anicteric OROPHARYNX: MMM, no exudates, no oropharyngeal erythema or ulceration NECK: supple, no JVD LYMPH:  no palpable lymphadenopathy in the cervical, axillary or inguinal regions LUNGS: clear to auscultation b/l with normal respiratory effort HEART: regular rate & rhythm ABDOMEN:  normoactive bowel sounds , non tender, not distended. Extremity: no pedal edema PSYCH: alert & oriented x 3 with fluent speech NEURO: no focal motor/sensory deficits   LABORATORY DATA:  I have reviewed the data as listed  .    Latest Ref Rng & Units 12/03/2023   11:39 AM 05/29/2023   11:52 AM 03/27/2023   12:14 PM  CBC  WBC 4.0 - 10.5 K/uL 3.5  3.1  3.5   Hemoglobin 12.0 - 15.0 g/dL 87.3  87.0  87.9   Hematocrit 36.0 - 46.0 % 38.7  40.7  37.7   Platelets 150 - 400 K/uL  243  245  223     .    Latest Ref Rng & Units 09/24/2023   11:12 AM 05/29/2023   11:52 AM 03/27/2023   12:14 PM  CMP  Glucose 70 - 99 mg/dL 72  86  79   BUN 8 - 27 mg/dL 18  17  17    Creatinine 0.57 - 1.00 mg/dL 9.03  9.08  9.11   Sodium 134 - 144 mmol/L 139  138  139   Potassium 3.5 - 5.2 mmol/L 4.2  4.2  4.0   Chloride 96 - 106 mmol/L 101  101  101   CO2 20 - 29 mmol/L 22  31  27    Calcium 8.7 - 10.3 mg/dL 9.3  89.8  9.2   Total Protein 6.5 - 8.1 g/dL  7.9    Total Bilirubin 0.0 - 1.2 mg/dL  0.5    Alkaline Phos 38 - 126 U/L  70    AST 15 - 41 U/L  22    ALT 0 - 44 U/L  21     . Lab Results  Component Value Date   IRON 148 05/29/2023   TIBC 393 05/29/2023   IRONPCTSAT 38 (H) 05/29/2023   (Iron and TIBC)  Lab Results  Component Value Date   FERRITIN 55 05/29/2023   B12 - 444--> 319  RADIOGRAPHIC STUDIES: I have personally reviewed the radiological images as listed and agreed with the findings in the report. No results found.  ASSESSMENT & PLAN:   63 yo female with:  1. 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 Cannot r/o GI losses  PLAN:  -discussed lab results from today, 12/03/2023, in detail with patient  FOLLOW-UP: Phone visit with 6 months with Dr Onesimo  Labs 7-10 days prior to phone visit  The total time spent in the appointment was *** minutes* .  All of the patient's questions were answered with apparent satisfaction. The patient knows to call the clinic with any problems, questions or concerns.   Emaline Saran MD MS AAHIVMS [facility] Hematology/Oncology Physician [facility]  .*Total Encounter Time as defined by the Centers for Medicare and Medicaid Services includes, in addition to the face-to-face time of a patient visit (documented in the note above) non-face-to-face time: obtaining and reviewing outside history, ordering  and reviewing medications, tests or procedures, care coordination (communications with other health care professionals or caregivers) and documentation in the medical record.    I,Mitra Faeizi,acting as a Neurosurgeon for Emaline Saran, MD.,have documented all relevant documentation on the behalf of Emaline Saran, MD,as directed by  Emaline Saran, MD while in the presence of Emaline Saran, MD.  ***

## 2023-12-11 ENCOUNTER — Encounter: Payer: Self-pay | Admitting: Hematology

## 2024-03-12 ENCOUNTER — Ambulatory Visit (INDEPENDENT_AMBULATORY_CARE_PROVIDER_SITE_OTHER): Payer: Self-pay | Admitting: Nurse Practitioner

## 2024-03-12 ENCOUNTER — Encounter: Payer: Self-pay | Admitting: Nurse Practitioner

## 2024-03-12 VITALS — BP 110/60 | HR 46 | Temp 98.0°F | Ht 64.0 in | Wt 163.0 lb

## 2024-03-12 DIAGNOSIS — E782 Mixed hyperlipidemia: Secondary | ICD-10-CM

## 2024-03-12 DIAGNOSIS — Z13228 Encounter for screening for other metabolic disorders: Secondary | ICD-10-CM

## 2024-03-12 DIAGNOSIS — H918X3 Other specified hearing loss, bilateral: Secondary | ICD-10-CM

## 2024-03-12 DIAGNOSIS — Z1231 Encounter for screening mammogram for malignant neoplasm of breast: Secondary | ICD-10-CM | POA: Diagnosis not present

## 2024-03-12 DIAGNOSIS — I10 Essential (primary) hypertension: Secondary | ICD-10-CM

## 2024-03-12 DIAGNOSIS — E663 Overweight: Secondary | ICD-10-CM | POA: Diagnosis not present

## 2024-03-12 DIAGNOSIS — Z6827 Body mass index (BMI) 27.0-27.9, adult: Secondary | ICD-10-CM

## 2024-03-12 DIAGNOSIS — K5904 Chronic idiopathic constipation: Secondary | ICD-10-CM | POA: Diagnosis not present

## 2024-03-12 DIAGNOSIS — R42 Dizziness and giddiness: Secondary | ICD-10-CM | POA: Diagnosis not present

## 2024-03-12 DIAGNOSIS — Z0000 Encounter for general adult medical examination without abnormal findings: Secondary | ICD-10-CM

## 2024-03-12 DIAGNOSIS — Z2821 Immunization not carried out because of patient refusal: Secondary | ICD-10-CM | POA: Diagnosis not present

## 2024-03-12 DIAGNOSIS — D508 Other iron deficiency anemias: Secondary | ICD-10-CM | POA: Diagnosis not present

## 2024-03-12 LAB — POCT URINALYSIS DIP (CLINITEK)
Bilirubin, UA: NEGATIVE
Blood, UA: NEGATIVE
Glucose, UA: NEGATIVE mg/dL
Ketones, POC UA: NEGATIVE mg/dL
Leukocytes, UA: NEGATIVE
Nitrite, UA: NEGATIVE
POC PROTEIN,UA: NEGATIVE
Spec Grav, UA: 1.015 (ref 1.010–1.025)
Urobilinogen, UA: 0.2 U/dL
pH, UA: 8.5 — AB (ref 5.0–8.0)

## 2024-03-12 NOTE — Progress Notes (Signed)
 LILLETTE Kristeen JINNY Gladis, CMA,acting as a neurosurgeon for Gaines Ada, FNP.,have documented all relevant documentation on the behalf of Gaines Ada, FNP,as directed by  Gaines Ada, FNP while in the presence of Gaines Ada, FNP.  Subjective:    Patient ID: Cindy Cain , female    DOB: 1960/12/14 , 63 y.o.   MRN: [id number]  Chief Complaint  Patient presents with   Annual Exam    Patient presents today for HM, Patient reports compliance with medication. Patient denies any chest pain, SOB, or headaches. Patient has no concerns today.      HPI  Discussed the use of AI scribe software for clinical note transcription with the patient, who gave verbal consent to proceed.  History of Present Illness Cindy Cain is a 63 year old female who presents for an annual physical exam.  She has been diagnosed with severe iron deficiency by her hematologist and is concerned about her lab results, noting some values are high or low. She is unsure if additional tests are needed during this visit.  She experiences occasional dizziness, described as a quick sensation occurring once or twice a week, unrelated to changes in position or strenuous activities. She does not monitor her blood pressure at home but has a cuff available. She maintains good hydration by drinking a lot of water.  She has a history of sinus bradycardia, previously evaluated by a cardiologist, who attributed it to her high level of physical fitness. She recalls being cleared for surgery after her evaluation by the cardiologist.  She experiences chronic constipation and uses stool softeners regularly. She also has allergy symptoms, including runny eyes and fluid buildup behind her ears, which she attributes to allergies.  Her exercise routine includes working out four days a week, and she describes her eating habits as decent. She had a hysterectomy in the past and does not have a gynecologist. She is considering getting a mammogram and a hearing  test, as she feels her hearing may be impaired.  Past Medical History:  Diagnosis Date   Anemia 2016   Body mass index (BMI) of 35.0 to 35.9 in adult 01/23/2018   IMO SNOMED Dx Update Oct 2024     Hypertension    Knee pain 01/23/2018   Persistent cough 06/23/2019   Stroke (HCC)    TIA     Family History  Problem Relation Age of Onset   COPD Mother    Alcohol abuse Mother    Arthritis Mother    Hemolytic uremic syndrome Father    Kidney disease Father    Heart attack Brother    Diabetes Brother    Hypertension Brother    Allergies Son    Cancer Maternal Uncle    Early death Maternal Uncle    Hypertension Brother      Current Outpatient Medications:    CALCIUM PO, Take 1 tablet by mouth daily., Disp: , Rfl:    Cholecalciferol (VITAMIN D3) 125 MCG (5000 UT) CAPS, Take by mouth., Disp: , Rfl:    Cyanocobalamin  (VITAMIN B 12 PO), Take 1,000 mg by mouth daily., Disp: , Rfl:    docusate sodium (COLACE) 100 MG capsule, Take by mouth as needed., Disp: , Rfl:    lisinopril -hydrochlorothiazide  (ZESTORETIC ) 10-12.5 MG tablet, Take 1 tablet by mouth daily., Disp: 90 tablet, Rfl: 2   loratadine  (CLARITIN ) 10 MG tablet, Take 1 tablet (10 mg total) by mouth daily. (Patient taking differently: Take 10 mg by mouth daily as needed.), Disp:  90 tablet, Rfl: 1   magnesium gluconate (MAGONATE) 500 MG tablet, Take 500 mg by mouth daily., Disp: , Rfl:    meclizine  (ANTIVERT ) 12.5 MG tablet, Take 1 tablet (12.5 mg total) by mouth 3 (three) times daily as needed for dizziness., Disp: 30 tablet, Rfl: 2   Multiple Vitamins-Minerals (MULTIVITAMIN WITH MINERALS) tablet, Take 1 tablet by mouth daily., Disp: , Rfl:    Thiamine HCl (VITAMIN B-1) 250 MG tablet, Take by mouth., Disp: , Rfl:    Allergies  Allergen Reactions   Aspirin Other (See Comments)    Ulcers    Codeine Other (See Comments)    ulcers      The patient states she uses status post hysterectomy for birth control. No LMP recorded.  Patient has had a hysterectomy.  Negative for: breast discharge, breast lump(s), breast pain and breast self exam. Associated symptoms include abnormal vaginal bleeding. Pertinent negatives include abnormal bleeding (hematology), anxiety, decreased libido, depression, difficulty falling sleep, dyspareunia, history of infertility, nocturia, sexual dysfunction, sleep disturbances, urinary incontinence, urinary urgency, vaginal discharge and vaginal itching. Diet regular; tries to eat healthy most days.  The patient states her exercise level is moderate to vigorous 4 days a week.   The patient's tobacco use is:  Social History   Tobacco Use  Smoking Status Never  Smokeless Tobacco Never   She has been exposed to passive smoke. The patient's alcohol use is:  Social History   Substance and Sexual Activity  Alcohol Use No    Review of Systems  Constitutional: Negative.   HENT: Negative.    Eyes: Negative.   Respiratory: Negative.    Cardiovascular: Negative.   Gastrointestinal:  Positive for constipation.  Endocrine: Negative.   Musculoskeletal: Negative.   Skin: Negative.   Allergic/Immunologic: Negative.   Neurological:  Negative for dizziness, weakness and light-headedness.  Hematological: Negative.   Psychiatric/Behavioral: Negative.       Today's Vitals   03/12/24 1048  BP: 110/60  Pulse: (!) 46  Temp: 98 F (36.7 C)  TempSrc: Oral  Weight: 163 lb (73.9 kg)  Height: 5' 4 (1.626 m)  PainSc: 0-No pain   Body mass index is 27.98 kg/m.  Wt Readings from Last 3 Encounters:  03/12/24 163 lb (73.9 kg)  12/03/23 158 lb 8 oz (71.9 kg)  09/24/23 161 lb 6.4 oz (73.2 kg)     Objective:  Physical Exam Vitals and nursing note reviewed.  Constitutional:      General: She is not in acute distress.    Appearance: Normal appearance. She is well-developed.  HENT:     Head: Normocephalic and atraumatic.     Right Ear: Hearing, ear canal and external ear normal. There is no  impacted cerumen. Tympanic membrane is bulging.     Left Ear: Hearing, ear canal and external ear normal. There is no impacted cerumen. Tympanic membrane is bulging.     Nose: Nose normal.     Mouth/Throat:     Mouth: Mucous membranes are moist.  Eyes:     General: Lids are normal.     Extraocular Movements: Extraocular movements intact.     Conjunctiva/sclera: Conjunctivae normal.     Pupils: Pupils are equal, round, and reactive to light.     Funduscopic exam:    Right eye: No papilledema.        Left eye: No papilledema.  Neck:     Thyroid: No thyroid mass.     Vascular: No carotid bruit.  Cardiovascular:  Rate and Rhythm: Normal rate and regular rhythm.     Pulses: Normal pulses.     Heart sounds: Normal heart sounds. No murmur heard. Pulmonary:     Effort: Pulmonary effort is normal. No respiratory distress.     Breath sounds: Normal breath sounds. No wheezing.  Chest:     Chest wall: No mass.  Breasts:    Tanner Score is 5.     Right: Normal. No mass or tenderness.     Left: Normal. No mass or tenderness.  Abdominal:     General: Abdomen is flat. Bowel sounds are normal. There is no distension.     Palpations: Abdomen is soft.     Tenderness: There is no abdominal tenderness.  Musculoskeletal:        General: No swelling. Normal range of motion.     Cervical back: Full passive range of motion without pain, normal range of motion and neck supple.     Right lower leg: No edema.     Left lower leg: No edema.  Lymphadenopathy:     Upper Body:     Right upper body: No supraclavicular, axillary or pectoral adenopathy.     Left upper body: No supraclavicular, axillary or pectoral adenopathy.  Skin:    General: Skin is warm and dry.     Capillary Refill: Capillary refill takes less than 2 seconds.  Neurological:     General: No focal deficit present.     Mental Status: She is alert and oriented to person, place, and time.     Cranial Nerves: No cranial nerve deficit.      Sensory: No sensory deficit.     Motor: No weakness.  Psychiatric:        Mood and Affect: Mood normal.        Behavior: Behavior normal.        Thought Content: Thought content normal.        Judgment: Judgment normal.      Assessment And Plan:     Encounter for annual health examination Assessment & Plan: Low heart rate likely due to fitness. Dizziness not linked to positional changes. No cardiology follow-up needed. Declined flu shot. - Continue current exercise regimen. - Monitor blood pressure at home daily. - If dizziness worsens, schedule labs within the month. - Schedule mammogram at the breast center. - Checked hearing in office; will refer to audiologist if needed.   Essential hypertension Assessment & Plan: Blood pressure is well-controlled at 110/60 mmHg. - Continue current antihypertensive regimen. - EKG done with marked bradycardia HR 46 - she has had a workup with Cardiology  Orders: -     EKG 12-Lead -     POCT URINALYSIS DIP (CLINITEK) -     Microalbumin / creatinine urine ratio  Mixed hyperlipidemia Assessment & Plan: Cholesterol levels were normal at last visit, will recheck today    Iron deficiency anemia secondary to inadequate dietary iron intake Assessment & Plan: Confirmed by hematologist. No acute concerns. - Continue follow-up with hematologist every six months.   Influenza vaccination declined  Herpes zoster vaccination declined  Overweight with body mass index (BMI) of 27 to 27.9 in adult  Dizziness Assessment & Plan: Intermittent dizziness not associated with positional changes. Possible causes include low blood pressure or dehydration. Cardiologist cleared for surgery due to sinus bradycardia from fitness. - Monitor blood pressure at home daily. - Ensure adequate hydration. - If dizziness worsens, schedule labs within the month.   Encounter  for screening for metabolic disorder  Encounter for screening mammogram for breast  cancer Assessment & Plan: Low heart rate likely due to fitness. Dizziness not linked to positional changes. No cardiology follow-up needed. Declined flu shot. - Continue current exercise regimen. - Monitor blood pressure at home daily. - If dizziness worsens, schedule labs within the month. - Schedule mammogram at the breast center. - Checked hearing in office; will refer to audiologist if needed.  Orders: -     Digital Screening Mammogram, Left and Right; Future  Other specified hearing loss of both ears Assessment & Plan: Reports difficulty hearing, possibly related to fluid buildup from allergies. - Checked hearing in office failed multiple areas; will refer to audiologist if needed.  Orders: -     Ambulatory referral to Audiology  Chronic idiopathic constipation Assessment & Plan:  no acute changes. - Continue current management with docusate sodium as needed.   Encounter for health maintenance examination Assessment & Plan: Low heart rate likely due to fitness. Dizziness not linked to positional changes. No cardiology follow-up needed. Declined flu shot. - Continue current exercise regimen. - Monitor blood pressure at home daily. - If dizziness worsens, schedule labs within the month. - Schedule mammogram at the breast center. - Checked hearing in office; will refer to audiologist if needed.     Return for 1 year physical, 6 month bp check. Patient was given opportunity to ask questions. Patient verbalized understanding of the plan and was able to repeat key elements of the plan. All questions were answered to their satisfaction.   Gaines Ada, FNP  I, Gaines Ada, FNP, have reviewed all documentation for this visit. The documentation on 11/16055 for the exam, diagnosis, procedures, and orders are all accurate and complete.

## 2024-03-13 ENCOUNTER — Ambulatory Visit: Payer: Self-pay | Admitting: Nurse Practitioner

## 2024-03-13 LAB — MICROALBUMIN / CREATININE URINE RATIO
Creatinine, Urine: 56.8 mg/dL
Microalb/Creat Ratio: <5 mg/g{creat} (ref 0–29)
Microalbumin, Urine: <3 ug/mL

## 2024-03-20 DIAGNOSIS — Z0000 Encounter for general adult medical examination without abnormal findings: Secondary | ICD-10-CM | POA: Insufficient documentation

## 2024-03-20 DIAGNOSIS — H9190 Unspecified hearing loss, unspecified ear: Secondary | ICD-10-CM | POA: Insufficient documentation

## 2024-03-20 NOTE — Assessment & Plan Note (Signed)
 Low heart rate likely due to fitness. Dizziness not linked to positional changes. No cardiology follow-up needed. Declined flu shot. - Continue current exercise regimen. - Monitor blood pressure at home daily. - If dizziness worsens, schedule labs within the month. - Schedule mammogram at the breast center. - Checked hearing in office; will refer to audiologist if needed.

## 2024-03-20 NOTE — Assessment & Plan Note (Signed)
 no acute changes. - Continue current management with docusate sodium as needed.

## 2024-03-20 NOTE — Assessment & Plan Note (Signed)
 Reports difficulty hearing, possibly related to fluid buildup from allergies. - Checked hearing in office failed multiple areas; will refer to audiologist if needed.

## 2024-03-20 NOTE — Assessment & Plan Note (Signed)
 Blood pressure is well-controlled at 110/60 mmHg. - Continue current antihypertensive regimen. - EKG done with marked bradycardia HR 46 - she has had a workup with Cardiology

## 2024-03-20 NOTE — Assessment & Plan Note (Signed)
 Intermittent dizziness not associated with positional changes. Possible causes include low blood pressure or dehydration. Cardiologist cleared for surgery due to sinus bradycardia from fitness. - Monitor blood pressure at home daily. - Ensure adequate hydration. - If dizziness worsens, schedule labs within the month.

## 2024-03-23 NOTE — Assessment & Plan Note (Signed)
 Confirmed by hematologist. No acute concerns. - Continue follow-up with hematologist every six months.

## 2024-03-23 NOTE — Assessment & Plan Note (Signed)
 Cholesterol levels were normal at last visit, will recheck today

## 2024-04-08 ENCOUNTER — Ambulatory Visit

## 2024-04-17 ENCOUNTER — Ambulatory Visit: Attending: Nurse Practitioner | Admitting: Audiology

## 2024-04-17 ENCOUNTER — Encounter: Payer: Self-pay | Admitting: Audiology

## 2024-04-17 DIAGNOSIS — H903 Sensorineural hearing loss, bilateral: Secondary | ICD-10-CM | POA: Insufficient documentation

## 2024-04-17 NOTE — Procedures (Signed)
°  Outpatient Audiology and [facility] [address] [place], [place]  [address] [phone]  AUDIOLOGICAL  EVALUATION  NAME: Cindy Cain     DOB:   Aug 11, 1960      MRN: [id number]                                                                                     DATE: 04/17/2024     REFERENT: Georgina Speaks, FNP STATUS: Outpatient DIAGNOSIS: sensorineural hearing loss   History: Brunella was seen for an audiological evaluation due to concerns regarding her hearing sensitivity. Skylar reports decreased hearing occurring for 2 years. Tashiba denies otalgia, aural fullness, and dizziness. Ciarah reports intermittent tinnitus. Jodeen reports her hearing sensitivity in better in the left ear.   Evaluation:  Otoscopy showed a clear view of the tympanic membranes, bilaterally Tympanometry results were consistent with normal middle ear function (Type A), bilaterally.  Audiometric testing was completed using Conventional Audiometry techniques with insert earphones and TDH headphones. Test results are consistent with a mild sensorineural hearing. Asymmetry noted at 2000-8000 Hz worse in the right ear. Speech Recognition Thresholds were obtained at 35 dB HL in the right ear and at 25  dB HL in the left ear. Word Recognition Testing was completed at 75 dB HL and Julia scored 100% in the right ear and was completed at 65 dB HL and Tajanay scored 100% in the left ear.    Results:  The test results were reviewed with [facility].  Test results are consistent with a mild sensorineural hearing. Asymmetry noted at 2000-8000 Hz worse in the right ear.  Corrin will have hearing and communication difficulty in adverse listening environments. She will benefit from the use of good communication strategies and the use of a hearing aid for the right ear. A referral to an ENT was reviewed due to asymmetric hearing loss.   Recommendations: 1.   Evaluation by an ENT due to asymmetric hearing loss 2.   Use  of hearing aid for the right ear if motivated 3.   Monitor hearing sensitivity    30 minutes spent testing and counseling on results.   If you have any questions please feel free to contact me at [phone].  Darryle Posey Audiologist, Au.D., CCC-A 04/17/2024  1:45 PM  Cc: Georgina Speaks, FNP

## 2024-05-02 ENCOUNTER — Ambulatory Visit

## 2024-05-20 ENCOUNTER — Ambulatory Visit
Admission: RE | Admit: 2024-05-20 | Discharge: 2024-05-20 | Disposition: A | Source: Ambulatory Visit | Attending: Nurse Practitioner | Admitting: Nurse Practitioner

## 2024-05-20 DIAGNOSIS — Z1231 Encounter for screening mammogram for malignant neoplasm of breast: Secondary | ICD-10-CM

## 2024-05-28 ENCOUNTER — Inpatient Hospital Stay: Attending: Hematology

## 2024-05-28 DIAGNOSIS — Z9884 Bariatric surgery status: Secondary | ICD-10-CM | POA: Insufficient documentation

## 2024-05-28 DIAGNOSIS — D709 Neutropenia, unspecified: Secondary | ICD-10-CM | POA: Insufficient documentation

## 2024-05-28 DIAGNOSIS — D509 Iron deficiency anemia, unspecified: Secondary | ICD-10-CM | POA: Diagnosis present

## 2024-05-28 LAB — CMP (CANCER CENTER ONLY)
ALT: 41 U/L (ref 0–44)
AST: 34 U/L (ref 15–41)
Albumin: 4.4 g/dL (ref 3.5–5.0)
Alkaline Phosphatase: 72 U/L (ref 38–126)
Anion gap: 10 (ref 5–15)
BUN: 19 mg/dL (ref 8–23)
CO2: 29 mmol/L (ref 22–32)
Calcium: 9.7 mg/dL (ref 8.9–10.3)
Chloride: 101 mmol/L (ref 98–111)
Creatinine: 1 mg/dL (ref 0.44–1.00)
GFR, Estimated: >60 mL/min
Glucose, Bld: 75 mg/dL (ref 70–99)
Potassium: 3.7 mmol/L (ref 3.5–5.1)
Sodium: 140 mmol/L (ref 135–145)
Total Bilirubin: 0.4 mg/dL (ref 0.0–1.2)
Total Protein: 7.7 g/dL (ref 6.5–8.1)

## 2024-05-28 LAB — CBC WITH DIFFERENTIAL (CANCER CENTER ONLY)
Abs Immature Granulocytes: 0 K/uL (ref 0.00–0.07)
Basophils Absolute: 0 K/uL (ref 0.0–0.1)
Basophils Relative: 1 %
Eosinophils Absolute: 0.1 K/uL (ref 0.0–0.5)
Eosinophils Relative: 3 %
HCT: 40.2 % (ref 36.0–46.0)
Hemoglobin: 13.1 g/dL (ref 12.0–15.0)
Immature Granulocytes: 0 %
Lymphocytes Relative: 52 %
Lymphs Abs: 1.9 K/uL (ref 0.7–4.0)
MCH: 28.7 pg (ref 26.0–34.0)
MCHC: 32.6 g/dL (ref 30.0–36.0)
MCV: 88.2 fL (ref 80.0–100.0)
Monocytes Absolute: 0.3 K/uL (ref 0.1–1.0)
Monocytes Relative: 9 %
Neutro Abs: 1.3 K/uL — ABNORMAL LOW (ref 1.7–7.7)
Neutrophils Relative %: 35 %
Platelet Count: 241 K/uL (ref 150–400)
RBC: 4.56 MIL/uL (ref 3.87–5.11)
RDW: 12 % (ref 11.5–15.5)
WBC Count: 3.7 K/uL — ABNORMAL LOW (ref 4.0–10.5)
nRBC: 0 % (ref 0.0–0.2)

## 2024-05-28 LAB — FOLATE: Folate: >20 ng/mL

## 2024-05-28 LAB — VITAMIN B12: Vitamin B-12: 2054 pg/mL — ABNORMAL HIGH (ref 180–914)

## 2024-05-29 LAB — KAPPA/LAMBDA LIGHT CHAINS
Kappa free light chain: 24.3 mg/L — ABNORMAL HIGH (ref 3.3–19.4)
Kappa, lambda light chain ratio: 2.01 — ABNORMAL HIGH (ref 0.26–1.65)
Lambda free light chains: 12.1 mg/L (ref 5.7–26.3)

## 2024-05-30 LAB — MULTIPLE MYELOMA PANEL, SERUM
Albumin SerPl Elph-Mcnc: 3.7 g/dL (ref 2.9–4.4)
Albumin/Glob SerPl: 1.2 (ref 0.7–1.7)
Alpha 1: 0.2 g/dL (ref 0.0–0.4)
Alpha2 Glob SerPl Elph-Mcnc: 0.6 g/dL (ref 0.4–1.0)
B-Globulin SerPl Elph-Mcnc: 0.9 g/dL (ref 0.7–1.3)
Gamma Glob SerPl Elph-Mcnc: 1.4 g/dL (ref 0.4–1.8)
Globulin, Total: 3.1 g/dL (ref 2.2–3.9)
IgA: 106 mg/dL (ref 87–352)
IgG (Immunoglobin G), Serum: 1482 mg/dL (ref 586–1602)
IgM (Immunoglobulin M), Srm: 108 mg/dL (ref 26–217)
Total Protein ELP: 6.8 g/dL (ref 6.0–8.5)

## 2024-05-30 LAB — COPPER, SERUM: Copper: 119 ug/dL (ref 76–142)

## 2024-05-30 LAB — ZINC: Zinc: 83 ug/dL (ref 44–115)

## 2024-06-04 ENCOUNTER — Inpatient Hospital Stay: Admitting: Hematology

## 2024-06-04 DIAGNOSIS — D509 Iron deficiency anemia, unspecified: Secondary | ICD-10-CM | POA: Diagnosis not present

## 2024-06-04 DIAGNOSIS — D709 Neutropenia, unspecified: Secondary | ICD-10-CM | POA: Diagnosis not present

## 2024-06-06 NOTE — Progress Notes (Incomplete)
 " HEMATOLOGY ONCOLOGY TELEPHONE PROGRESS NOTE  Date of service: 06/04/2024  Patient Care Team: Georgina Speaks, FNP as PCP - General (General Practice) O'Neal, Darryle Ned, MD as PCP - Cardiology (Cardiology) GI - Dr. Renaye Sous  CHIEF COMPLAINT/PURPOSE OF CONSULTATION: Follow-up for continued evaluation and management of Leukopenia   HISTORY OF PRESENTING ILLNESS:  Cindy Cain is a wonderful 64 y.o. female who has been referred to us  by Speaks Georgina, NP for evaluation and management of anemia.   The pt reports she has been iron deficient for a long time. She notes that she has been on po  Iron for>10 yrs- multivitamin with iron, iron 45mg +23 mg, constipation. -heavy periods. Hysterectomy 2005. -roux en gastric bypass in 2013 -vegan diet for 4 yrs -vertigo -covid 04/2019- has pulmonary f/u   Most recent lab results (01/21/2020) of CBC & CMP is as follows: all values are WNL except for Hgb at 9.7, MCV at 75, MCH at 20.6, MCHC at 27.7, RDW at 15.7.SABRA 01/21/2020 Vitamin D at 66.2 01/21/2020 TIBC at 462, UIBC at 433, Iron at 29, Iron Sat at 6, Ferritin at 10   On review of systems, pt reports significant fatigue and some DOE. No overt GI bleeding. No menstrual losses.  INTERVAL HISTORY: I connected with Cindy Cain on 06/04/2024 at  3:30 PM EST by telephone and verified that I am speaking with the correct person using two identifiers.   I discussed the limitations, risks, security and privacy concerns of performing an evaluation and management service by telemedicine and the availability of in-person appointments. I also discussed with the patient that there may be a patient responsible charge related to this service. The patient expressed understanding and agreed to proceed.   Other persons participating in the visit and their role in the encounter:{attendingwith:33940}  Patients location: Home Providers location: [facility]   Chief Complaint: continued evaluation and management  of Leukopenia.  she was last seen by me on 12/03/2023; at the time she did not have any concerns and was doing well.    Today, she      REVIEW OF SYSTEMS:   10 Point review of systems of done and is negative except as noted above.  MEDICAL HISTORY Past Medical History:  Diagnosis Date   Anemia 2016   Body mass index (BMI) of 35.0 to 35.9 in adult 01/23/2018   IMO SNOMED Dx Update Oct 2024     Hypertension    Knee pain 01/23/2018   Persistent cough 06/23/2019   Stroke (HCC)    TIA    SURGICAL HISTORY Past Surgical History:  Procedure Laterality Date   ABDOMINAL HYSTERECTOMY     partial, secondary to bleeding and anemia   APPENDECTOMY     CESAREAN SECTION  03/16/1989   CHOLECYSTECTOMY     COLON SURGERY  2014   Bypass Surgery   COSMETIC SURGERY  2025   Penniculectomy   GASTRIC BYPASS     TUBAL LIGATION  02/24/1992    SOCIAL HISTORY Social History[1]  Social History   Social History Narrative   Not on file    SOCIAL DRIVERS OF HEALTH SDOH Screenings   Food Insecurity: No Food Insecurity (03/11/2024)  Housing: Low Risk (03/11/2024)  Transportation Needs: No Transportation Needs (03/11/2024)  Depression (PHQ2-9): Low Risk (09/24/2023)  Financial Resource Strain: Low Risk (03/11/2024)  Physical Activity: Sufficiently Active (03/11/2024)  Social Connections: Socially Integrated (03/11/2024)  Stress: No Stress Concern Present (03/11/2024)  Tobacco Use: Low Risk (03/12/2024)  FAMILY HISTORY Family History  Problem Relation Age of Onset   COPD Mother    Alcohol abuse Mother    Arthritis Mother    Hemolytic uremic syndrome Father    Kidney disease Father    Heart attack Brother    Diabetes Brother    Hypertension Brother    Allergies Son    Cancer Maternal Uncle    Early death Maternal Uncle    Hypertension Brother      ALLERGIES: is allergic to aspirin and codeine.  MEDICATIONS  Current Outpatient Medications  Medication  Sig Dispense Refill   CALCIUM PO Take 1 tablet by mouth daily.     Cholecalciferol (VITAMIN D3) 125 MCG (5000 UT) CAPS Take by mouth.     Cyanocobalamin  (VITAMIN B 12 PO) Take 1,000 mg by mouth daily.     docusate sodium (COLACE) 100 MG capsule Take by mouth as needed.     lisinopril -hydrochlorothiazide  (ZESTORETIC ) 10-12.5 MG tablet Take 1 tablet by mouth daily. 90 tablet 2   loratadine  (CLARITIN ) 10 MG tablet Take 1 tablet (10 mg total) by mouth daily. (Patient taking differently: Take 10 mg by mouth daily as needed.) 90 tablet 1   magnesium gluconate (MAGONATE) 500 MG tablet Take 500 mg by mouth daily.     meclizine  (ANTIVERT ) 12.5 MG tablet Take 1 tablet (12.5 mg total) by mouth 3 (three) times daily as needed for dizziness. 30 tablet 2   Multiple Vitamins-Minerals (MULTIVITAMIN WITH MINERALS) tablet Take 1 tablet by mouth daily.     Thiamine HCl (VITAMIN B-1) 250 MG tablet Take by mouth.     No current facility-administered medications for this visit.    PHYSICAL EXAMINATION TELEPHONE VISIT:  GENERAL: sounds alert, in no acute distress and comfortable PSYCH: sounds alert & oriented x 3 with fluent speech  LABORATORY DATA:   I have reviewed the data as listed     Latest Ref Rng & Units 05/28/2024   11:01 AM 12/03/2023   11:39 AM 05/29/2023   11:52 AM  CBC EXTENDED  WBC 4.0 - 10.5 K/uL 3.7  3.5  3.1   RBC 3.87 - 5.11 MIL/uL 4.56  4.42  4.45   Hemoglobin 12.0 - 15.0 g/dL 86.8  87.3  87.0   HCT 36.0 - 46.0 % 40.2  38.7  40.7   Platelets 150 - 400 K/uL 241  243  245   NEUT# 1.7 - 7.7 K/uL 1.3  1.0  1.0   Lymph# 0.7 - 4.0 K/uL 1.9  1.9  1.7     {ELGK_Labs:34608}  - Discussed lab results on 05/28/2024 in detail with patient: {ELGKCBCMK2:34678} {RFE:65320} {ELGKOtherLabs (Optional):33764}     Latest Ref Rng & Units 05/28/2024   11:01 AM 12/03/2023   11:39 AM 09/24/2023   11:12 AM  CMP  Glucose 70 - 99 mg/dL 75  86  72   BUN 8 - 23 mg/dL 19  17  18    Creatinine  0.44 - 1.00 mg/dL 8.99  9.00  9.03   Sodium 135 - 145 mmol/L 140  141  139   Potassium 3.5 - 5.1 mmol/L 3.7  4.3  4.2   Chloride 98 - 111 mmol/L 101  106  101   CO2 22 - 32 mmol/L 29  31  22    Calcium 8.9 - 10.3 mg/dL 9.7  9.1  9.3   Total Protein 6.5 - 8.1 g/dL 7.7  6.8    Total Bilirubin 0.0 - 1.2 mg/dL 0.4  0.5  Alkaline Phos 38 - 126 U/L 72  64    AST 15 - 41 U/L 34  50    ALT 0 - 44 U/L 41  54      PREVIOUS STUDIES:  RADIOGRAPHIC STUDIES: I have personally reviewed the radiological images as listed and agreed with the findings in the report. MM 3D SCREENING MAMMOGRAM BILATERAL BREAST Result Date: 05/22/2024 CLINICAL DATA:  Screening. EXAM: DIGITAL SCREENING BILATERAL MAMMOGRAM WITH TOMOSYNTHESIS AND CAD TECHNIQUE: Bilateral screening digital craniocaudal and mediolateral oblique mammograms were obtained. Bilateral screening digital breast tomosynthesis was performed. The images were evaluated with computer-aided detection. COMPARISON:  Previous exam(s). ACR Breast Density Category c: The breasts are heterogeneously dense, which may obscure small masses. FINDINGS: There are no findings suspicious for malignancy. IMPRESSION: No mammographic evidence of malignancy. A result letter of this screening mammogram will be mailed directly to the patient. RECOMMENDATION: Screening mammogram in one year. (Code:SM-B-01Y) BI-RADS CATEGORY  1: Negative. Electronically Signed   By: Dina  Arceo M.D.   On: 05/22/2024 13:52    ASSESSMENT & PLAN:  64 y.o. female with    PLAN: - Discussed lab results on *** in detail with patient: {ELGKCBCMK2:34678} {RFE:65320} {ELGKOtherLabs (Optional):33764}   FOLLOW-UP in {WEEKS/MONTHS:30939} for labs and follow-up with Dr. Onesimo.  The total time spent in the appointment was *** minutes* .  All of the patient's questions were answered and the patient knows to call the clinic with any problems, questions, or concerns.  Emaline Onesimo MD MS AAHIVMS [facility]  [facility] Hematology/Oncology Physician [facility]  *Total Encounter Time as defined by the Centers for Medicare and Medicaid Services includes, in addition to the face-to-face time of a patient visit (documented in the note above) non-face-to-face time: obtaining and reviewing outside history, ordering and reviewing medications, tests or procedures, care coordination (communications with other health care professionals or caregivers) and documentation in the medical record.  I,Emily Lagle,acting as a neurosurgeon for Emaline Onesimo, MD.,have documented all relevant documentation on the behalf of Emaline Onesimo, MD,as directed by  Emaline Onesimo, MD while in the presence of Emaline Onesimo, MD.  I have reviewed the above documentation for accuracy and completeness, and I agree with the above.  Emaline Onesimo, MD       [1] Social History Tobacco Use   Smoking status: Never   Smokeless tobacco: Never  Vaping Use   Vaping status: Never Used  Substance Use Topics   Alcohol use: No   Drug use: Never  "

## 2024-06-06 NOTE — Progress Notes (Signed)
 " HEMATOLOGY ONCOLOGY TELEPHONE PROGRESS NOTE  Date of service: 06/04/2024  Patient Care Team: Georgina Speaks, FNP as PCP - General (General Practice) O'Neal, Darryle Ned, MD as PCP - Cardiology (Cardiology) GI - Dr. Renaye Sous  CHIEF COMPLAINT/PURPOSE OF CONSULTATION: Follow-up for continued evaluation and management of Leukopenia   HISTORY OF PRESENTING ILLNESS:  Cindy Cain is a wonderful 64 y.o. female who has been referred to us  by Speaks Georgina, NP for evaluation and management of anemia.   The pt reports she has been iron deficient for a long time. She notes that she has been on po  Iron for>10 yrs- multivitamin with iron, iron 45mg +23 mg, constipation. -heavy periods. Hysterectomy 2005. -roux en gastric bypass in 2013 -vegan diet for 4 yrs -vertigo -covid 04/2019- has pulmonary f/u   Most recent lab results (01/21/2020) of CBC & CMP is as follows: all values are WNL except for Hgb at 9.7, MCV at 75, MCH at 20.6, MCHC at 27.7, RDW at 15.7.SABRA 01/21/2020 Vitamin D at 66.2 01/21/2020 TIBC at 462, UIBC at 433, Iron at 29, Iron Sat at 6, Ferritin at 10   On review of systems, pt reports significant fatigue and some DOE. No overt GI bleeding. No menstrual losses.  INTERVAL HISTORY: I connected with Karna VEAR Molt on 06/04/2024 at  3:30 PM EST by telephone and verified that I am speaking with the correct person using two identifiers.   I discussed the limitations, risks, security and privacy concerns of performing an evaluation and management service by telemedicine and the availability of in-person appointments. I also discussed with the patient that there may be a patient responsible charge related to this service. The patient expressed understanding and agreed to proceed.   Other persons participating in the visit and their role in the encounter:{attendingwith:[id number]}  Patients location: Home Providers location: [facility]   Chief Complaint: continued evaluation and management  of Leukopenia.  she was last seen by me on 12/03/2023; at the time she did not have any concerns and was doing well.    Today, she      REVIEW OF SYSTEMS:   10 Point review of systems of done and is negative except as noted above.  MEDICAL HISTORY Past Medical History:  Diagnosis Date   Anemia 2016   Body mass index (BMI) of 35.0 to 35.9 in adult 01/23/2018   IMO SNOMED Dx Update Oct 2024     Hypertension    Knee pain 01/23/2018   Persistent cough 06/23/2019   Stroke (HCC)    TIA    SURGICAL HISTORY Past Surgical History:  Procedure Laterality Date   ABDOMINAL HYSTERECTOMY     partial, secondary to bleeding and anemia   APPENDECTOMY     CESAREAN SECTION  03/16/1989   CHOLECYSTECTOMY     COLON SURGERY  2014   Bypass Surgery   COSMETIC SURGERY  2025   Penniculectomy   GASTRIC BYPASS     TUBAL LIGATION  02/24/1992    SOCIAL HISTORY Social History[1]  Social History   Social History Narrative   Not on file    SOCIAL DRIVERS OF HEALTH SDOH Screenings   Food Insecurity: No Food Insecurity (03/11/2024)  Housing: Low Risk (03/11/2024)  Transportation Needs: No Transportation Needs (03/11/2024)  Depression (PHQ2-9): Low Risk (09/24/2023)  Financial Resource Strain: Low Risk (03/11/2024)  Physical Activity: Sufficiently Active (03/11/2024)  Social Connections: Socially Integrated (03/11/2024)  Stress: No Stress Concern Present (03/11/2024)  Tobacco Use: Low Risk (03/12/2024)  FAMILY HISTORY Family History  Problem Relation Age of Onset   COPD Mother    Alcohol abuse Mother    Arthritis Mother    Hemolytic uremic syndrome Father    Kidney disease Father    Heart attack Brother    Diabetes Brother    Hypertension Brother    Allergies Son    Cancer Maternal Uncle    Early death Maternal Uncle    Hypertension Brother    ALLERGIES: is allergic to aspirin and codeine.  MEDICATIONS  Current Outpatient Medications  Medication Sig Dispense Refill   CALCIUM  PO Take 1 tablet by mouth daily.     Cholecalciferol (VITAMIN D3) 125 MCG (5000 UT) CAPS Take by mouth.     Cyanocobalamin  (VITAMIN B 12 PO) Take 1,000 mg by mouth daily.     docusate sodium (COLACE) 100 MG capsule Take by mouth as needed.     lisinopril -hydrochlorothiazide  (ZESTORETIC ) 10-12.5 MG tablet Take 1 tablet by mouth daily. 90 tablet 2   loratadine  (CLARITIN ) 10 MG tablet Take 1 tablet (10 mg total) by mouth daily. (Patient taking differently: Take 10 mg by mouth daily as needed.) 90 tablet 1   magnesium gluconate (MAGONATE) 500 MG tablet Take 500 mg by mouth daily.     meclizine  (ANTIVERT ) 12.5 MG tablet Take 1 tablet (12.5 mg total) by mouth 3 (three) times daily as needed for dizziness. 30 tablet 2   Multiple Vitamins-Minerals (MULTIVITAMIN WITH MINERALS) tablet Take 1 tablet by mouth daily.     Thiamine HCl (VITAMIN B-1) 250 MG tablet Take by mouth.     No current facility-administered medications for this visit.    PHYSICAL EXAMINATION TELEPHONE VISIT:  GENERAL: sounds alert, in no acute distress and comfortable PSYCH: sounds alert & oriented x 3 with fluent speech  LABORATORY DATA:   I have reviewed the data as listed     Latest Ref Rng & Units 05/28/2024   11:01 AM 12/03/2023   11:39 AM 05/29/2023   11:52 AM  CBC EXTENDED  WBC 4.0 - 10.5 K/uL 3.7  3.5  3.1   RBC 3.87 - 5.11 MIL/uL 4.56  4.42  4.45   Hemoglobin 12.0 - 15.0 g/dL 86.8  87.3  87.0   HCT 36.0 - 46.0 % 40.2  38.7  40.7   Platelets 150 - 400 K/uL 241  243  245   NEUT# 1.7 - 7.7 K/uL 1.3  1.0  1.0   Lymph# 0.7 - 4.0 K/uL 1.9  1.9  1.7    IRON STUDIES Lab Results  Component Value Date   IRON 164 12/03/2023   IRON 148 05/29/2023    Lab Results  Component Value Date   UIBC 173 12/03/2023   UIBC 245 05/29/2023    Lab Results  Component Value Date   TIBC 337 12/03/2023   TIBC 393 05/29/2023    Lab Results  Component Value Date   IRONPCTSAT 49 (H) 12/03/2023   IRONPCTSAT 38 (H) 05/29/2023     Lab Results  Component Value Date   FERRITIN 55 12/03/2023   FERRITIN 55 05/29/2023     Lab Results  Component Value Date   VITAMINB12 2,054 (H) 05/28/2024   VITAMINB12 857 12/03/2023   FOLATE >20.0 05/28/2024    05/28/2024  IgG (Immunoglobin G), Serum     586 - 1,602 mg/dL 8,517   IgA     87 - 647 mg/dL 893   IgM (Immunoglobulin M), Srm     26 -  217 mg/dL 891   Total Protein ELP     6.0 - 8.5 g/dL 6.8 (C)  Albumin SerPl Elph-Mcnc     2.9 - 4.4 g/dL 3.7 (C)  Alpha 1     0.0 - 0.4 g/dL 0.2 (C)  Alpha2 Glob SerPl Elph-Mcnc     0.4 - 1.0 g/dL 0.6 (C)  B-Globulin SerPl Elph-Mcnc     0.7 - 1.3 g/dL 0.9 (C)  Gamma Glob SerPl Elph-Mcnc     0.4 - 1.8 g/dL 1.4 (C)  M Protein SerPl Elph-Mcnc     Not Observed g/dL Not Observed (C)  Globulin, Total     2.2 - 3.9 g/dL 3.1 (C)  Albumin/Glob SerPl     0.7 - 1.7  1.2 (C)  IFE 1 The immunofixation pattern appears unremarkable. Evidence of  monoclonal protein is not apparent.   Kappa free light chain     3.3 - 19.4 mg/L 24.3 (H)   Lambda free light chains     5.7 - 26.3 mg/L 12.1   Kappa, lambda light chain ratio     0.26 - 1.65  2.01 (H)   Zinc     44 - 115 ug/dL 83   Copper     76 - 857 ug/dL 880       Latest Ref Rng & Units 05/28/2024   11:01 AM 12/03/2023   11:39 AM 09/24/2023   11:12 AM  CMP  Glucose 70 - 99 mg/dL 75  86  72   BUN 8 - 23 mg/dL 19  17  18    Creatinine 0.44 - 1.00 mg/dL 8.99  9.00  9.03   Sodium 135 - 145 mmol/L 140  141  139   Potassium 3.5 - 5.1 mmol/L 3.7  4.3  4.2   Chloride 98 - 111 mmol/L 101  106  101   CO2 22 - 32 mmol/L 29  31  22    Calcium 8.9 - 10.3 mg/dL 9.7  9.1  9.3   Total Protein 6.5 - 8.1 g/dL 7.7  6.8    Total Bilirubin 0.0 - 1.2 mg/dL 0.4  0.5    Alkaline Phos 38 - 126 U/L 72  64    AST 15 - 41 U/L 34  50    ALT 0 - 44 U/L 41  54     RADIOGRAPHIC STUDIES: I have personally reviewed the radiological images as listed and agreed with the findings in the report. MM 3D SCREENING  MAMMOGRAM BILATERAL BREAST Result Date: 05/22/2024 CLINICAL DATA:  Screening. EXAM: DIGITAL SCREENING BILATERAL MAMMOGRAM WITH TOMOSYNTHESIS AND CAD TECHNIQUE: Bilateral screening digital craniocaudal and mediolateral oblique mammograms were obtained. Bilateral screening digital breast tomosynthesis was performed. The images were evaluated with computer-aided detection. COMPARISON:  Previous exam(s). ACR Breast Density Category c: The breasts are heterogeneously dense, which may obscure small masses. FINDINGS: There are no findings suspicious for malignancy. IMPRESSION: No mammographic evidence of malignancy. A result letter of this screening mammogram will be mailed directly to the patient. RECOMMENDATION: Screening mammogram in one year. (Code:SM-B-01Y) BI-RADS CATEGORY  1: Negative. Electronically Signed   By: Dina  Arceo M.D.   On: 05/22/2024 13:52    ASSESSMENT & PLAN:  64 y.o. female with  1. 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 Cannot r/o GI losses   2.  Mild leukopenia with neutropenia ANC of 1000 total WBC count of 3.5k Patient has had  low white counts on and off for at least 3 years  PLAN: - Discussed lab results on 05/28/2024 in detail with patient: CBC: WBC of 3.7K , increased from 3.5K, Hemoglobin of 13.1 , Neutrophils of 1.3K , increased from 1.0K CMP: is stable. Vitamin B12 and Folate: 2,054 and >20 Copper 119 and Zinc 83 Iron% 49 increased from 38 and Ferritin 55.   M protein undetectable with unremarkable IFE.   K/L with elevated Kappa Light Chains.    FOLLOW-UP Phone visit with 12 months with Dr Onesimo  Labs 7-10 days prior to phone visit  The total time spent in the appointment was *** minutes* .  All of the patient's questions were answered and the patient knows to call the clinic with any problems, questions, or concerns.  Emaline Onesimo MD MS AAHIVMS [facility]  [facility] Hematology/Oncology Physician [facility]  *Total Encounter Time as defined by the Centers for Medicare and Medicaid Services includes, in addition to the face-to-face time of a patient visit (documented in the note above) non-face-to-face time: obtaining and reviewing outside history, ordering and reviewing medications, tests or procedures, care coordination (communications with other health care professionals or caregivers) and documentation in the medical record.  I,Emily Lagle,acting as a neurosurgeon for Emaline Onesimo, MD.,have documented all relevant documentation on the behalf of Emaline Onesimo, MD,as directed by  Emaline Onesimo, MD while in the presence of Emaline Onesimo, MD.  I have reviewed the above documentation for accuracy and completeness, and I agree with the above.  Emaline Onesimo, MD     [1]  Social History Tobacco Use   Smoking status: Never   Smokeless tobacco: Never  Vaping Use   Vaping status: Never Used  Substance Use Topics   Alcohol use: No   Drug use: Never   "

## 2024-06-11 ENCOUNTER — Encounter: Payer: Self-pay | Admitting: Hematology

## 2024-09-10 ENCOUNTER — Ambulatory Visit: Admitting: Nurse Practitioner

## 2025-03-17 ENCOUNTER — Encounter: Admitting: Nurse Practitioner
